# Patient Record
Sex: Female | Born: 1965 | Race: White | Hispanic: No | Smoking: Former smoker
Health system: Southern US, Community
[De-identification: ages and names within clinical notes are randomized; demographics above are authoritative.]

## PROBLEM LIST (undated history)

## (undated) DIAGNOSIS — B977 Papillomavirus as the cause of diseases classified elsewhere: Secondary | ICD-10-CM

## (undated) DIAGNOSIS — Z9889 Other specified postprocedural states: Secondary | ICD-10-CM

## (undated) DIAGNOSIS — F419 Anxiety disorder, unspecified: Secondary | ICD-10-CM

## (undated) DIAGNOSIS — M1990 Unspecified osteoarthritis, unspecified site: Secondary | ICD-10-CM

## (undated) DIAGNOSIS — J189 Pneumonia, unspecified organism: Secondary | ICD-10-CM

## (undated) DIAGNOSIS — E039 Hypothyroidism, unspecified: Secondary | ICD-10-CM

## (undated) DIAGNOSIS — M549 Dorsalgia, unspecified: Secondary | ICD-10-CM

## (undated) DIAGNOSIS — R002 Palpitations: Secondary | ICD-10-CM

## (undated) DIAGNOSIS — K760 Fatty (change of) liver, not elsewhere classified: Secondary | ICD-10-CM

## (undated) DIAGNOSIS — R112 Nausea with vomiting, unspecified: Secondary | ICD-10-CM

## (undated) DIAGNOSIS — R011 Cardiac murmur, unspecified: Secondary | ICD-10-CM

## (undated) DIAGNOSIS — K589 Irritable bowel syndrome without diarrhea: Secondary | ICD-10-CM

## (undated) DIAGNOSIS — F32A Depression, unspecified: Secondary | ICD-10-CM

## (undated) DIAGNOSIS — G4730 Sleep apnea, unspecified: Secondary | ICD-10-CM

## (undated) DIAGNOSIS — I10 Essential (primary) hypertension: Secondary | ICD-10-CM

## (undated) DIAGNOSIS — E559 Vitamin D deficiency, unspecified: Secondary | ICD-10-CM

## (undated) DIAGNOSIS — M2550 Pain in unspecified joint: Secondary | ICD-10-CM

## (undated) HISTORY — DX: Morbid (severe) obesity due to excess calories: E66.01

## (undated) HISTORY — DX: Palpitations: R00.2

## (undated) HISTORY — DX: Sleep apnea, unspecified: G47.30

## (undated) HISTORY — DX: Irritable bowel syndrome, unspecified: K58.9

## (undated) HISTORY — PX: APPENDECTOMY: SHX54

## (undated) HISTORY — DX: Fatty (change of) liver, not elsewhere classified: K76.0

## (undated) HISTORY — DX: Anxiety disorder, unspecified: F41.9

## (undated) HISTORY — DX: Unspecified osteoarthritis, unspecified site: M19.90

## (undated) HISTORY — PX: COLPOSCOPY: SHX161

## (undated) HISTORY — DX: Depression, unspecified: F32.A

## (undated) HISTORY — PX: KNEE ARTHROSCOPY W/ MENISCECTOMY: SHX1879

## (undated) HISTORY — DX: Vitamin D deficiency, unspecified: E55.9

## (undated) HISTORY — PX: WISDOM TOOTH EXTRACTION: SHX21

## (undated) HISTORY — DX: Pain in unspecified joint: M25.50

## (undated) HISTORY — DX: Hypothyroidism, unspecified: E03.9

## (undated) HISTORY — DX: Dorsalgia, unspecified: M54.9

---

## 1999-05-31 ENCOUNTER — Other Ambulatory Visit: Admission: RE | Admit: 1999-05-31 | Discharge: 1999-05-31 | Payer: Self-pay | Admitting: Obstetrics and Gynecology

## 1999-12-08 ENCOUNTER — Inpatient Hospital Stay (HOSPITAL_COMMUNITY): Admission: AD | Admit: 1999-12-08 | Discharge: 1999-12-08 | Payer: Self-pay | Admitting: Obstetrics and Gynecology

## 1999-12-14 ENCOUNTER — Inpatient Hospital Stay (HOSPITAL_COMMUNITY): Admission: AD | Admit: 1999-12-14 | Discharge: 1999-12-17 | Payer: Self-pay | Admitting: *Deleted

## 2000-07-23 ENCOUNTER — Other Ambulatory Visit: Admission: RE | Admit: 2000-07-23 | Discharge: 2000-07-23 | Payer: Self-pay | Admitting: Obstetrics and Gynecology

## 2001-07-31 ENCOUNTER — Other Ambulatory Visit: Admission: RE | Admit: 2001-07-31 | Discharge: 2001-07-31 | Payer: Self-pay | Admitting: Obstetrics and Gynecology

## 2001-12-18 ENCOUNTER — Encounter: Admission: RE | Admit: 2001-12-18 | Discharge: 2001-12-18 | Payer: Self-pay | Admitting: Endocrinology

## 2001-12-18 ENCOUNTER — Encounter: Payer: Self-pay | Admitting: Endocrinology

## 2002-05-21 ENCOUNTER — Ambulatory Visit (HOSPITAL_COMMUNITY): Admission: RE | Admit: 2002-05-21 | Discharge: 2002-05-21 | Payer: Self-pay | Admitting: Obstetrics and Gynecology

## 2002-05-21 ENCOUNTER — Encounter: Payer: Self-pay | Admitting: Obstetrics and Gynecology

## 2002-06-11 ENCOUNTER — Encounter: Payer: Self-pay | Admitting: Obstetrics and Gynecology

## 2002-06-11 ENCOUNTER — Ambulatory Visit (HOSPITAL_COMMUNITY): Admission: RE | Admit: 2002-06-11 | Discharge: 2002-06-11 | Payer: Self-pay | Admitting: Obstetrics and Gynecology

## 2002-07-30 ENCOUNTER — Inpatient Hospital Stay (HOSPITAL_COMMUNITY): Admission: AD | Admit: 2002-07-30 | Discharge: 2002-07-30 | Payer: Self-pay | Admitting: Obstetrics and Gynecology

## 2002-10-20 ENCOUNTER — Inpatient Hospital Stay (HOSPITAL_COMMUNITY): Admission: AD | Admit: 2002-10-20 | Discharge: 2002-10-22 | Payer: Self-pay | Admitting: Obstetrics and Gynecology

## 2002-12-08 ENCOUNTER — Other Ambulatory Visit: Admission: RE | Admit: 2002-12-08 | Discharge: 2002-12-08 | Payer: Self-pay | Admitting: Obstetrics and Gynecology

## 2003-12-24 ENCOUNTER — Other Ambulatory Visit: Admission: RE | Admit: 2003-12-24 | Discharge: 2003-12-24 | Payer: Self-pay | Admitting: Obstetrics and Gynecology

## 2005-02-28 ENCOUNTER — Other Ambulatory Visit: Admission: RE | Admit: 2005-02-28 | Discharge: 2005-02-28 | Payer: Self-pay | Admitting: Obstetrics and Gynecology

## 2005-04-26 ENCOUNTER — Ambulatory Visit (HOSPITAL_COMMUNITY): Admission: RE | Admit: 2005-04-26 | Discharge: 2005-04-26 | Payer: Self-pay | Admitting: Obstetrics and Gynecology

## 2006-03-06 ENCOUNTER — Other Ambulatory Visit: Admission: RE | Admit: 2006-03-06 | Discharge: 2006-03-06 | Payer: Self-pay | Admitting: Obstetrics and Gynecology

## 2006-03-06 DIAGNOSIS — R87619 Unspecified abnormal cytological findings in specimens from cervix uteri: Secondary | ICD-10-CM | POA: Insufficient documentation

## 2006-04-08 HISTORY — PX: COLPOSCOPY: SHX161

## 2008-01-30 ENCOUNTER — Ambulatory Visit (HOSPITAL_COMMUNITY): Admission: RE | Admit: 2008-01-30 | Discharge: 2008-01-30 | Payer: Self-pay | Admitting: Obstetrics and Gynecology

## 2009-06-22 ENCOUNTER — Ambulatory Visit (HOSPITAL_COMMUNITY): Admission: RE | Admit: 2009-06-22 | Discharge: 2009-06-22 | Payer: Self-pay | Admitting: Obstetrics and Gynecology
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
## 2010-09-29 NOTE — Op Note (Signed)
[facility]  Patient:    SHALLEN, LUEDKE                   MRN: [id number] Proc. Date: 12/15/99 Adm. Date:  [id number] Disc. Date: [id number] Attending:  Leonard Schwartz                           Operative Report  OPERATION:                    Vacuum-assisted vaginal delivery with a ____                               vacuum device.  SURGEON:                      Georgina Peer, M.D.  ASSISTANT:                    Miguel Dibble, C.N.M.  ANESTHESIA:                   Epidural.  INDICATIONS:                  This is a 45 year old primigravida at 41-6/7 weeks.  Progressed to complete dilatation at 5:46.  Rested and began pushing. Pushed for three hours, became fatigued, and asked for assistance.  Vacuum assisted delivery was offered to the patient.  Risks and complications including accentuation of caput, bruising, caput hematoma, septal hematoma, and intracranial hemorrhage also discussed and accepted.  DESCRIPTION OF PROCEDURE:     The vertex was at +2 to +3, LOA position.  The vacuum was applied and delivery was effected within five contractions over a second degree midline episiotomy at 11:47 of a viable female infant with Apgars of 8 and 9.  The second degree episiotomy was repaired without extension with absorbable suture.  The placenta spontaneously delivered at 12:17, three vessels, bilobed and intact.  Infant went to regular nursery. Estimated blood loss was 300 cc. DD:  12/15/99 TD:  12/18/99 Job: [id number]

## 2011-06-17 ENCOUNTER — Ambulatory Visit (INDEPENDENT_AMBULATORY_CARE_PROVIDER_SITE_OTHER): Payer: Managed Care, Other (non HMO) | Admitting: Physician Assistant

## 2011-06-17 VITALS — BP 122/78 | HR 79 | Temp 98.4°F | Resp 16 | Ht 65.0 in | Wt 258.0 lb

## 2011-06-17 DIAGNOSIS — J069 Acute upper respiratory infection, unspecified: Secondary | ICD-10-CM

## 2011-06-17 DIAGNOSIS — R05 Cough: Secondary | ICD-10-CM

## 2011-06-17 LAB — POCT INFLUENZA A/B
Influenza A, POC: NEGATIVE
Influenza B, POC: NEGATIVE

## 2011-06-17 MED ORDER — ALBUTEROL SULFATE HFA 108 (90 BASE) MCG/ACT IN AERS: 2.0000 | INHALATION_SPRAY | Freq: Every day | RESPIRATORY_TRACT | Status: DC

## 2011-06-17 MED ORDER — PROMETHAZINE-DM 6.25-15 MG/5ML PO SYRP: 5.0000 mL | ORAL_SOLUTION | Freq: Every day | ORAL | Status: AC

## 2011-06-17 MED ORDER — IPRATROPIUM BROMIDE 0.06 % NA SOLN: 2.0000 | Freq: Four times a day (QID) | NASAL | Status: DC

## 2011-06-17 NOTE — Patient Instructions (Signed)
Watch for fever/chills, shortness of breath, prolonged fever. Return if these things occur. Ibuprofen 200 mg up to 4 tabs every 6 hours Mucinex DM as directed    Upper Respiratory Infection, Adult An upper respiratory infection (URI) is also known as the common cold. It is often caused by a type of germ (virus). Colds are easily spread (contagious). You can pass it to others by kissing, coughing, sneezing, or drinking out of the same glass. Usually, you get better in 1 or 2 weeks.  HOME CARE   Only take medicine as told by your doctor.   Use a warm mist humidifier or breathe in steam from a hot shower.   Drink enough water and fluids to keep your pee (urine) clear or pale yellow.   Get plenty of rest.   Return to work when your temperature is back to normal or as told by your doctor. You may use a face mask and wash your hands to stop your cold from spreading.  GET HELP RIGHT AWAY IF:   After the first few days, you feel you are getting worse.   You have questions about your medicine.   You have chills, shortness of breath, or brown or red spit (mucus).   You have yellow or brown snot (nasal discharge) or pain in the face, especially when you bend forward.   You have a fever, puffy (swollen) neck, pain when you swallow, or white spots in the back of your throat.   You have a bad headache, ear pain, sinus pain, or chest pain.   You have a high-pitched whistling sound when you breathe in and out (wheezing).   You have a lasting cough or cough up blood.   You have sore muscles or a stiff neck.  MAKE SURE YOU:   Understand these instructions.   Will watch your condition.   Will get help right away if you are not doing well or get worse.  Document Released: 10/17/2007 Document Revised: 01/10/2011 Document Reviewed: 09/04/2010 [facility] Patient Information 2012 [place], [place].

## 2011-06-17 NOTE — Progress Notes (Signed)
  Subjective:    Patient ID: Dana Liu, female    DOB: 17-Jul-1965, 46 y.o.   MRN: [id number]  HPI    Review of Systems  Constitutional: Positive for fever (99.9). Negative for chills.  HENT: Positive for congestion and sore throat. Negative for rhinorrhea.   Eyes: Negative for visual disturbance.  Respiratory: Positive for cough (paroxysmal) and shortness of breath.   Cardiovascular: Negative for chest pain.  Musculoskeletal: Positive for myalgias.  Neurological: Positive for headaches.       Objective:   Physical Exam  Constitutional: She appears well-developed and well-nourished.  HENT:  Right Ear: Tympanic membrane and external ear normal.  Left Ear: Tympanic membrane and external ear normal.  Nose: Nose normal.  Mouth/Throat: Posterior oropharyngeal erythema present. No oropharyngeal exudate.  Pulmonary/Chest: Effort normal and breath sounds normal.  Lymphadenopathy:    She has no cervical adenopathy.    Results for orders placed in visit on 06/17/11  POCT INFLUENZA A/B      Component Value Range   Influenza A, POC Negative     Influenza B, POC Negative           Assessment & Plan:   1. URI (upper respiratory infection)  promethazine-dextromethorphan (PROMETHAZINE-DM) 6.25-15 MG/5ML syrup, albuterol (PROVENTIL HFA;VENTOLIN HFA) 108 (90 BASE) MCG/ACT inhaler, ipratropium (ATROVENT) 0.06 % nasal spray  2. Cough  POCT Influenza A/B, promethazine-dextromethorphan (PROMETHAZINE-DM) 6.25-15 MG/5ML syrup, albuterol (PROVENTIL HFA;VENTOLIN HFA) 108 (90 BASE) MCG/ACT inhaler   OOW note 2/4 Return if increased SOB, fever, worsening symptoms

## 2011-07-23 ENCOUNTER — Encounter: Payer: Self-pay | Admitting: Obstetrics and Gynecology

## 2011-07-27 ENCOUNTER — Encounter: Payer: Self-pay | Admitting: Obstetrics and Gynecology

## 2011-08-08 ENCOUNTER — Encounter (INDEPENDENT_AMBULATORY_CARE_PROVIDER_SITE_OTHER): Payer: Commercial Indemnity | Admitting: Obstetrics and Gynecology

## 2011-08-08 DIAGNOSIS — Z3040 Encounter for surveillance of contraceptives, unspecified: Secondary | ICD-10-CM

## 2011-08-20 ENCOUNTER — Encounter (INDEPENDENT_AMBULATORY_CARE_PROVIDER_SITE_OTHER): Payer: Commercial Indemnity | Admitting: Obstetrics and Gynecology

## 2011-08-20 DIAGNOSIS — Z3049 Encounter for surveillance of other contraceptives: Secondary | ICD-10-CM

## 2011-08-20 DIAGNOSIS — Z30430 Encounter for insertion of intrauterine contraceptive device: Secondary | ICD-10-CM

## 2011-09-17 ENCOUNTER — Encounter: Payer: Commercial Indemnity | Admitting: Obstetrics and Gynecology

## 2011-09-19 ENCOUNTER — Ambulatory Visit (INDEPENDENT_AMBULATORY_CARE_PROVIDER_SITE_OTHER): Payer: Commercial Indemnity | Admitting: Obstetrics and Gynecology

## 2011-09-19 ENCOUNTER — Encounter: Payer: Self-pay | Admitting: Obstetrics and Gynecology

## 2011-09-19 VITALS — BP 114/72 | Temp 98.6°F | Wt 250.0 lb

## 2011-09-19 DIAGNOSIS — R05 Cough: Secondary | ICD-10-CM

## 2011-09-19 DIAGNOSIS — J069 Acute upper respiratory infection, unspecified: Secondary | ICD-10-CM

## 2011-09-19 DIAGNOSIS — N6320 Unspecified lump in the left breast, unspecified quadrant: Secondary | ICD-10-CM

## 2011-09-19 DIAGNOSIS — J40 Bronchitis, not specified as acute or chronic: Secondary | ICD-10-CM

## 2011-09-19 DIAGNOSIS — N63 Unspecified lump in unspecified breast: Secondary | ICD-10-CM

## 2011-09-19 DIAGNOSIS — Z975 Presence of (intrauterine) contraceptive device: Secondary | ICD-10-CM

## 2011-09-19 DIAGNOSIS — G4730 Sleep apnea, unspecified: Secondary | ICD-10-CM

## 2011-09-19 DIAGNOSIS — G4733 Obstructive sleep apnea (adult) (pediatric): Secondary | ICD-10-CM | POA: Insufficient documentation

## 2011-09-19 DIAGNOSIS — Z30431 Encounter for routine checking of intrauterine contraceptive device: Secondary | ICD-10-CM

## 2011-09-19 MED ORDER — AZITHROMYCIN 250 MG PO TABS: ORAL_TABLET | ORAL | Status: AC

## 2011-09-19 MED ORDER — ALBUTEROL SULFATE HFA 108 (90 BASE) MCG/ACT IN AERS
2.0000 | INHALATION_SPRAY | Freq: Four times a day (QID) | RESPIRATORY_TRACT | Status: DC | PRN
Start: 2011-09-19 — End: 2016-05-01

## 2011-09-19 NOTE — Progress Notes (Signed)
46 YO with Mirena insertion 08/20/11 returns for follow-up.  She also complains of wet cough without sputum production for 1 week.  + nasal congestion, sore throat but no fever, myalgias, post-nasal drainage or ear pain.  Has taken Robitussin and will occasionally awaken at night.  Has had urine leaking with cough so she wears a pad but in relation to IUD changes in pads are every 4 hours.    Since IUD insertion has a spot on left breast that is tender. (feels bruised)  This has decreased since beginning.  Lungs: coarse breath sounds with increase expiratory phase and rhonchi  Heart regular rate and rhythm Left breast- laterally tender cystic mass at 3 o'clock approximately  2 cm x 2cm   Pelvic: EGBUS-wnl, vagina-scant blood, cervix-string visible, uterus-non-tender, appears normal size (exam limited by habitus)  UPT-negative U/A-negative  A; IUD Check     Bronchitis     Left Breast Mass     Incontinence due to cough     H/O Pneumonia x2       P: Albuterol Inhaler 2 puffs every 6 hours x 5 days then prn      Z-pak   #1 use as directed      Diagnostic Mammogram with ultrasound prn

## 2011-09-26 ENCOUNTER — Ambulatory Visit
Admission: RE | Admit: 2011-09-26 | Discharge: 2011-09-26 | Disposition: A | Discharge status: Home / Self Care | Payer: Managed Care, Other (non HMO) | Source: Ambulatory Visit | Attending: Obstetrics and Gynecology | Admitting: Obstetrics and Gynecology

## 2011-09-26 ENCOUNTER — Other Ambulatory Visit: Payer: Self-pay | Admitting: Obstetrics and Gynecology

## 2011-09-26 DIAGNOSIS — N6320 Unspecified lump in the left breast, unspecified quadrant: Secondary | ICD-10-CM

## 2011-10-22 ENCOUNTER — Ambulatory Visit (INDEPENDENT_AMBULATORY_CARE_PROVIDER_SITE_OTHER): Payer: Commercial Indemnity | Admitting: Obstetrics and Gynecology

## 2011-10-22 ENCOUNTER — Encounter: Payer: Self-pay | Admitting: Obstetrics and Gynecology

## 2011-10-22 VITALS — BP 112/78 | HR 84 | Ht 65.0 in | Wt 258.0 lb

## 2011-10-22 DIAGNOSIS — Z124 Encounter for screening for malignant neoplasm of cervix: Secondary | ICD-10-CM

## 2011-10-22 NOTE — Progress Notes (Signed)
Last Pap: 10/18/10 WNL: Yes Regular Periods:no Contraception: Mirena  Monthly Breast exam:yes Tetanus<57yrs:yes Nl.Bladder Function:yes Daily BMs:yes Healthy Diet:yes Calcium:yes Mammogram:yes 09/2011  Exercise:no Seatbelt: yes Abuse at home: no Stressful work: no Sigmoid-colonoscopy: n/a Bone Density: No Pt has questions about break through bleeding with Mirena. [name], [place] Pt without complaints Physical Examination: General appearance - alert, well appearing, and in no distress Mental status - normal mood, behavior, speech, dress, motor activity, and thought processes Neck - supple, no significant adenopathy, thyroid exam: thyroid is normal in size without nodules or tenderness Chest - clear to auscultation, no wheezes, rales or rhonchi, symmetric air entry Heart - normal rate and regular rhythm Abdomen - soft, nontender, nondistended, no masses or organomegaly Breasts - breasts appear normal, no suspicious masses, no skin or nipple changes or axillary nodes Pelvic - normal external genitalia, vulva, vagina, cervix, uterus and adnexa Rectal - normal rectal, no masses, rectal exam not indicated Back exam - full range of motion, no tenderness, palpable spasm or pain on motion Neurological - alert, oriented, normal speech, no focal findings or movement disorder noted Musculoskeletal - no joint tenderness, deformity or swelling Extremities - no edema, redness or tenderness in the calves or thighs Skin - normal coloration and turgor, no rashes, no suspicious skin lesions noted Routine exam Pap sent yes Mammogram due no *Mirena. Pt reassured about irreg spotting RT 1 yr

## 2011-10-24 LAB — PAP IG W/ RFLX HPV ASCU

## 2012-12-10 ENCOUNTER — Other Ambulatory Visit: Payer: Self-pay

## 2012-12-10 DIAGNOSIS — Z1231 Encounter for screening mammogram for malignant neoplasm of breast: Secondary | ICD-10-CM

## 2012-12-25 ENCOUNTER — Ambulatory Visit
Admission: RE | Admit: 2012-12-25 | Discharge: 2012-12-25 | Disposition: A | Discharge status: Home / Self Care | Payer: Managed Care, Other (non HMO) | Source: Ambulatory Visit

## 2012-12-25 DIAGNOSIS — Z1231 Encounter for screening mammogram for malignant neoplasm of breast: Secondary | ICD-10-CM

## 2013-11-17 ENCOUNTER — Other Ambulatory Visit (HOSPITAL_COMMUNITY): Payer: Self-pay | Admitting: Obstetrics and Gynecology

## 2013-11-17 DIAGNOSIS — Z1231 Encounter for screening mammogram for malignant neoplasm of breast: Secondary | ICD-10-CM

## 2013-12-28 ENCOUNTER — Ambulatory Visit (HOSPITAL_COMMUNITY)
Admission: RE | Admit: 2013-12-28 | Discharge: 2013-12-28 | Disposition: A | Discharge status: Home / Self Care | Payer: BC Managed Care – PPO | Source: Ambulatory Visit | Attending: Obstetrics and Gynecology | Admitting: Obstetrics and Gynecology

## 2013-12-28 DIAGNOSIS — Z1231 Encounter for screening mammogram for malignant neoplasm of breast: Secondary | ICD-10-CM | POA: Diagnosis present

## 2014-03-15 ENCOUNTER — Encounter: Payer: Self-pay | Admitting: Obstetrics and Gynecology

## 2014-05-14 HISTORY — PX: HYSTEROSCOPY W/ ENDOMETRIAL ABLATION: SUR665

## 2015-01-04 ENCOUNTER — Other Ambulatory Visit: Payer: Self-pay | Admitting: Obstetrics and Gynecology

## 2015-01-04 DIAGNOSIS — N6489 Other specified disorders of breast: Secondary | ICD-10-CM

## 2015-01-13 ENCOUNTER — Other Ambulatory Visit: Payer: BC Managed Care – PPO

## 2015-01-21 ENCOUNTER — Ambulatory Visit
Admission: RE | Admit: 2015-01-21 | Discharge: 2015-01-21 | Disposition: A | Discharge status: Home / Self Care | Payer: BC Managed Care – PPO | Source: Ambulatory Visit | Attending: Obstetrics and Gynecology | Admitting: Obstetrics and Gynecology

## 2015-01-21 ENCOUNTER — Other Ambulatory Visit: Payer: Self-pay | Admitting: Obstetrics and Gynecology

## 2015-01-21 DIAGNOSIS — N6489 Other specified disorders of breast: Secondary | ICD-10-CM

## 2015-01-28 ENCOUNTER — Other Ambulatory Visit: Payer: Self-pay | Admitting: Obstetrics and Gynecology

## 2015-01-28 DIAGNOSIS — N6489 Other specified disorders of breast: Secondary | ICD-10-CM

## 2015-01-31 ENCOUNTER — Ambulatory Visit
Admission: RE | Admit: 2015-01-31 | Discharge: 2015-01-31 | Disposition: A | Discharge status: Home / Self Care | Payer: BC Managed Care – PPO | Source: Ambulatory Visit | Attending: Obstetrics and Gynecology | Admitting: Obstetrics and Gynecology

## 2015-01-31 ENCOUNTER — Other Ambulatory Visit: Payer: Self-pay | Admitting: Obstetrics and Gynecology

## 2015-01-31 DIAGNOSIS — N6489 Other specified disorders of breast: Secondary | ICD-10-CM

## 2016-05-01 ENCOUNTER — Other Ambulatory Visit: Payer: Self-pay | Admitting: Obstetrics and Gynecology

## 2016-05-02 ENCOUNTER — Encounter: Payer: Self-pay | Admitting: Obstetrics and Gynecology

## 2016-05-03 ENCOUNTER — Encounter (HOSPITAL_COMMUNITY)
Admission: RE | Admit: 2016-05-03 | Discharge: 2016-05-03 | Disposition: A | Discharge status: Home / Self Care | Payer: BC Managed Care – PPO | Source: Ambulatory Visit | Attending: Obstetrics and Gynecology | Admitting: Obstetrics and Gynecology

## 2016-05-03 ENCOUNTER — Encounter (HOSPITAL_COMMUNITY): Payer: Self-pay

## 2016-05-03 DIAGNOSIS — Z01812 Encounter for preprocedural laboratory examination: Secondary | ICD-10-CM | POA: Insufficient documentation

## 2016-05-03 DIAGNOSIS — N83209 Unspecified ovarian cyst, unspecified side: Secondary | ICD-10-CM | POA: Diagnosis not present

## 2016-05-03 HISTORY — DX: Papillomavirus as the cause of diseases classified elsewhere: B97.7

## 2016-05-03 HISTORY — DX: Unspecified osteoarthritis, unspecified site: M19.90

## 2016-05-03 HISTORY — DX: Cardiac murmur, unspecified: R01.1

## 2016-05-03 HISTORY — DX: Pneumonia, unspecified organism: J18.9

## 2016-05-03 HISTORY — DX: Essential (primary) hypertension: I10

## 2016-05-03 LAB — CBC
HCT: 43.5 % (ref 36.0–46.0)
Hemoglobin: 14.8 g/dL (ref 12.0–15.0)
MCH: 30.5 pg (ref 26.0–34.0)
MCHC: 34 g/dL (ref 30.0–36.0)
MCV: 89.7 fL (ref 78.0–100.0)
PLATELETS: 277 10*3/uL (ref 150–400)
RBC: 4.85 MIL/uL (ref 3.87–5.11)
RDW: 12.7 % (ref 11.5–15.5)
WBC: 9.9 10*3/uL (ref 4.0–10.5)

## 2016-05-03 NOTE — Patient Instructions (Addendum)
Your procedure is scheduled on:  Friday, Dec, 29, 2017  Enter through the [employer] of [facility] at:  12:30 PM  Pick up the phone at the desk and dial [phone].  Call this number if you have problems the morning of surgery: [phone].  Remember: Do NOT eat food:  After Midnight Thursday  Do NOT drink clear liquids after:  10:00 AM day of surgery  Take these medicines the morning of surgery with a SIP OF WATER:  Citalopram, Levothyroxine  Stop ALL herbal medications and fish oil at this time   Do NOT wear jewelry (body piercing), metal hair clips/bobby pins, make-up, or nail polish. Do NOT wear lotions, powders, or perfumes.  You may wear deodorant. Do NOT shave for 48 hours prior to surgery. Do NOT bring valuables to the hospital. Contacts, dentures, or bridgework may not be worn into surgery.  Have a responsible adult drive you home and stay with you for 24 hours after your procedure

## 2016-05-03 NOTE — H&P (Signed)
Dana Liu is a 50 y.o. female,   P: 2-0-0-2 who presents for removal of a  complex left ovarian cyst.  During the patient's  annual exam in September 2017, the patient's IUD string was not visible.  Additionally she reported having several months of daily spotting when she previously has not had any.  A pelvic ultrasound performed at that time showed an 8.5 simple appearing left adnexal mass, obscuring the left ovary and a simple right ovarian cyst measuring 3.4 cm.  The patient went on to report feeling bloated, difficulty starting the defecation process (but no constipation) and pain with hip flexion and other bending positions.  She is not sexually active and has not had any changes in bladder function.  She admits to occasional cramping rated 6/10 on a 10 point pain scale,  accompanied by an increase in her vaginal spotting requiring the use of a pad 3 times a day. Fortunately she finds relief with Ibuprofen 400mg .  A follow up pelvic ultrasound in November 2017 showed a uterus: 6.48 x 3.95 x 4.46 cm,  endometrium: 0.51 cm, right ovary-5.59 cm and left ovary-9.18 cm with a cyst containing debris measuring 8.9 cm.   Given the increasing size of the left ovarian cyst and related symptoms,  the patient has decided to proceed with removal of her left ovarian cyst.   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   ROS: Admits left knee swelling and pain but denies headache, vision changes, nasal congestion, dysphagia, tinnitus, dizziness, hoarseness, cough,  chest pain, shortness of breath, nausea, vomiting, diarrhea, constipation,  urinary frequency, urgency  dysuria, hematuria, vaginitis symptoms,  easy bruising,  myalgias, arthralgias, skin rashes, unexplained weight loss and except as is mentioned in the history of present illness, patient's review of systems is otherwise negative.   Physical Exam  Bp: 130/70  P:  72 bpm    R: 16    Temperature: 98.6 degrees F orally  Weight: 237 lbs.  Height: 5' 4.25 "   BMI: 40.4  Neck: supple without masses or thyromegaly Lungs: clear to auscultation Heart: regular rate and rhythm Abdomen: soft, left lower quadrant tenderness without guarding or rebound; no organomegaly Pelvic:EGBUS- wnl; vagina-normal rugae; uterus-normal size, cervix without lesions or motion tenderness; adnexae-left  tenderness with palpable mass Extremities:  no clubbing, cyanosis or edema   Assesment: Large Complex Left Ovarian Cyst   Disposition:  A discussion was held with patient regarding the indication for her procedure(s) along with the risks, which include but are not limited to: reaction to anesthesia, damage to adjacent organs, infection, excessive  bleeding and possible removal of ovarian tissue. The patient verbalized understanding of these risks and has consented to proceed with a Laparoscopic Ovarian Cystectomy at [place] on May 11, 2016 at  2 p.m.    CSN# [id number]   Sora Olivo J. Florene Glen, PA-C  for Dr.Naima A. Dillard

## 2016-05-03 NOTE — Patient Instructions (Addendum)
Your procedure is scheduled on:  Friday, Dec. 29, 2017  Enter through the [employer] of [facility] at:  12:30 PM  Pick up the phone at the desk and dial [phone].  Call this number if you have problems the morning of surgery: [phone].  Remember: Do NOT eat food:  After midnight Thursday  Do NOT drink clear liquids after:  10:00 AM day of surgery  Take these medicines the morning of surgery with a SIP OF WATER:  Citalopram, Levothyroxine  Bring CPAP machine day of surgery  Stop ALL herbal medications and fish oil at this time   Do NOT wear jewelry (body piercing), metal hair clips/bobby pins, make-up, or nail polish. Do NOT wear lotions, powders, or perfumes.  You may wear deodorant. Do NOT shave for 48 hours prior to surgery. Do NOT bring valuables to the hospital. Contacts, dentures, or bridgework may not be worn into surgery.  Have a responsible adult drive you home and stay with you for 24 hours after your procedure

## 2016-05-04 ENCOUNTER — Inpatient Hospital Stay (HOSPITAL_COMMUNITY)
Admission: RE | Admit: 2016-05-04 | Discharge: 2016-05-04 | Disposition: A | Discharge status: Home / Self Care | Payer: BC Managed Care – PPO | Source: Ambulatory Visit

## 2016-05-11 ENCOUNTER — Encounter (HOSPITAL_COMMUNITY): Payer: Self-pay | Admitting: Anesthesiology

## 2016-05-11 ENCOUNTER — Ambulatory Visit (HOSPITAL_COMMUNITY)
Admission: RE | Admit: 2016-05-11 | Discharge: 2016-05-11 | Disposition: A | Discharge status: Home / Self Care | Payer: BC Managed Care – PPO | Source: Ambulatory Visit | Attending: Obstetrics and Gynecology | Admitting: Obstetrics and Gynecology

## 2016-05-11 ENCOUNTER — Ambulatory Visit (HOSPITAL_COMMUNITY): Payer: BC Managed Care – PPO | Admitting: Anesthesiology

## 2016-05-11 ENCOUNTER — Encounter (HOSPITAL_COMMUNITY)
Admission: RE | Disposition: A | Discharge status: Home / Self Care | Payer: Self-pay | Source: Ambulatory Visit | Attending: Obstetrics and Gynecology

## 2016-05-11 DIAGNOSIS — D271 Benign neoplasm of left ovary: Secondary | ICD-10-CM | POA: Insufficient documentation

## 2016-05-11 DIAGNOSIS — Z87891 Personal history of nicotine dependence: Secondary | ICD-10-CM | POA: Insufficient documentation

## 2016-05-11 DIAGNOSIS — R102 Pelvic and perineal pain: Secondary | ICD-10-CM | POA: Diagnosis not present

## 2016-05-11 DIAGNOSIS — Z30432 Encounter for removal of intrauterine contraceptive device: Secondary | ICD-10-CM | POA: Diagnosis not present

## 2016-05-11 DIAGNOSIS — F329 Major depressive disorder, single episode, unspecified: Secondary | ICD-10-CM | POA: Diagnosis not present

## 2016-05-11 DIAGNOSIS — M1712 Unilateral primary osteoarthritis, left knee: Secondary | ICD-10-CM | POA: Insufficient documentation

## 2016-05-11 DIAGNOSIS — E039 Hypothyroidism, unspecified: Secondary | ICD-10-CM | POA: Diagnosis not present

## 2016-05-11 DIAGNOSIS — Z6839 Body mass index (BMI) 39.0-39.9, adult: Secondary | ICD-10-CM | POA: Diagnosis not present

## 2016-05-11 DIAGNOSIS — N83202 Unspecified ovarian cyst, left side: Secondary | ICD-10-CM | POA: Diagnosis present

## 2016-05-11 DIAGNOSIS — G4730 Sleep apnea, unspecified: Secondary | ICD-10-CM | POA: Insufficient documentation

## 2016-05-11 DIAGNOSIS — E669 Obesity, unspecified: Secondary | ICD-10-CM | POA: Diagnosis not present

## 2016-05-11 HISTORY — DX: Other specified postprocedural states: Z98.890

## 2016-05-11 HISTORY — DX: Nausea with vomiting, unspecified: R11.2

## 2016-05-11 HISTORY — PX: LAPAROSCOPIC OVARIAN CYSTECTOMY: SHX6248

## 2016-05-11 HISTORY — PX: IUD REMOVAL: SHX5392

## 2016-05-11 LAB — PREGNANCY, URINE: Preg Test, Ur: NEGATIVE

## 2016-05-11 SURGERY — EXCISION, CYST, OVARY, LAPAROSCOPIC
Anesthesia: General | Site: Vagina

## 2016-05-11 MED ORDER — FENTANYL CITRATE (PF) 100 MCG/2ML IJ SOLN
INTRAMUSCULAR | Status: DC | PRN
  Administered 2016-05-11 (×2): 50 ug via INTRAVENOUS
  Administered 2016-05-11: 100 ug via INTRAVENOUS
  Administered 2016-05-11 (×2): 50 ug via INTRAVENOUS

## 2016-05-11 MED ORDER — MEPERIDINE HCL 25 MG/ML IJ SOLN: 6.2500 mg | INTRAMUSCULAR | Status: DC | PRN

## 2016-05-11 MED ORDER — GLYCOPYRROLATE 0.2 MG/ML IJ SOLN
INTRAMUSCULAR | Status: DC | PRN
  Administered 2016-05-11: 0.4 mg via INTRAVENOUS
  Administered 2016-05-11: 0.2 mg via INTRAVENOUS

## 2016-05-11 MED ORDER — LIDOCAINE HCL (CARDIAC) 20 MG/ML IV SOLN
INTRAVENOUS | Status: AC
  Filled 2016-05-11: qty 5

## 2016-05-11 MED ORDER — HYDROCODONE-ACETAMINOPHEN 5-325 MG PO TABS: 1.0000 | ORAL_TABLET | Freq: Four times a day (QID) | ORAL | 0 refills | Status: DC | PRN

## 2016-05-11 MED ORDER — DEXAMETHASONE SODIUM PHOSPHATE 10 MG/ML IJ SOLN
INTRAMUSCULAR | Status: DC | PRN
  Administered 2016-05-11: 4 mg via INTRAVENOUS

## 2016-05-11 MED ORDER — LACTATED RINGERS IR SOLN
Status: DC | PRN
  Administered 2016-05-11: 3000 mL

## 2016-05-11 MED ORDER — GLYCOPYRROLATE 0.2 MG/ML IJ SOLN
INTRAMUSCULAR | Status: AC
  Filled 2016-05-11: qty 1

## 2016-05-11 MED ORDER — NEOSTIGMINE METHYLSULFATE 10 MG/10ML IV SOLN
INTRAVENOUS | Status: AC
  Filled 2016-05-11: qty 1

## 2016-05-11 MED ORDER — DEXAMETHASONE SODIUM PHOSPHATE 4 MG/ML IJ SOLN
INTRAMUSCULAR | Status: AC
  Filled 2016-05-11: qty 1

## 2016-05-11 MED ORDER — LACTATED RINGERS IV SOLN
INTRAVENOUS | Status: DC
  Administered 2016-05-11 (×2): via INTRAVENOUS

## 2016-05-11 MED ORDER — ATROPINE SULFATE 0.4 MG/ML IJ SOLN
INTRAMUSCULAR | Status: DC | PRN
  Administered 2016-05-11: 0.4 mg via INTRAVENOUS

## 2016-05-11 MED ORDER — FENTANYL CITRATE (PF) 250 MCG/5ML IJ SOLN
INTRAMUSCULAR | Status: AC
  Filled 2016-05-11: qty 5

## 2016-05-11 MED ORDER — ATROPINE SULFATE 0.4 MG/ML IJ SOLN
INTRAMUSCULAR | Status: AC
  Filled 2016-05-11: qty 1

## 2016-05-11 MED ORDER — ROCURONIUM BROMIDE 100 MG/10ML IV SOLN
INTRAVENOUS | Status: AC
  Filled 2016-05-11: qty 1

## 2016-05-11 MED ORDER — ONDANSETRON HCL 4 MG/2ML IJ SOLN
INTRAMUSCULAR | Status: DC | PRN
  Administered 2016-05-11: 4 mg via INTRAVENOUS

## 2016-05-11 MED ORDER — ROCURONIUM BROMIDE 100 MG/10ML IV SOLN
INTRAVENOUS | Status: DC | PRN
  Administered 2016-05-11: 50 mg via INTRAVENOUS

## 2016-05-11 MED ORDER — FENTANYL CITRATE (PF) 100 MCG/2ML IJ SOLN
INTRAMUSCULAR | Status: AC
  Filled 2016-05-11: qty 2

## 2016-05-11 MED ORDER — PROPOFOL 10 MG/ML IV BOLUS
INTRAVENOUS | Status: DC | PRN
  Administered 2016-05-11: 170 mg via INTRAVENOUS

## 2016-05-11 MED ORDER — IBUPROFEN 600 MG PO TABS: 600.0000 mg | ORAL_TABLET | Freq: Four times a day (QID) | ORAL | 0 refills | Status: DC | PRN

## 2016-05-11 MED ORDER — DIPHENHYDRAMINE HCL 50 MG/ML IJ SOLN
INTRAMUSCULAR | Status: AC
  Administered 2016-05-11: 12.5 mg via INTRAVENOUS
  Filled 2016-05-11: qty 1

## 2016-05-11 MED ORDER — HYDROCODONE-ACETAMINOPHEN 7.5-325 MG PO TABS: 1.0000 | ORAL_TABLET | Freq: Once | ORAL | Status: DC | PRN

## 2016-05-11 MED ORDER — HYDROMORPHONE HCL 1 MG/ML IJ SOLN: 0.2500 mg | INTRAMUSCULAR | Status: DC | PRN

## 2016-05-11 MED ORDER — METOCLOPRAMIDE HCL 5 MG/ML IJ SOLN: 10.0000 mg | Freq: Once | INTRAMUSCULAR | Status: DC | PRN

## 2016-05-11 MED ORDER — SCOPOLAMINE 1 MG/3DAYS TD PT72
1.0000 | MEDICATED_PATCH | Freq: Once | TRANSDERMAL | Status: DC
  Administered 2016-05-11: 1.5 mg via TRANSDERMAL

## 2016-05-11 MED ORDER — EPHEDRINE 5 MG/ML INJ
INTRAVENOUS | Status: AC
  Filled 2016-05-11: qty 10

## 2016-05-11 MED ORDER — KETOROLAC TROMETHAMINE 30 MG/ML IJ SOLN
INTRAMUSCULAR | Status: AC
  Filled 2016-05-11: qty 1

## 2016-05-11 MED ORDER — BUPIVACAINE HCL (PF) 0.25 % IJ SOLN
INTRAMUSCULAR | Status: AC
  Filled 2016-05-11: qty 30

## 2016-05-11 MED ORDER — NEOSTIGMINE METHYLSULFATE 10 MG/10ML IV SOLN
INTRAVENOUS | Status: DC | PRN
  Administered 2016-05-11: 3 mg via INTRAVENOUS

## 2016-05-11 MED ORDER — EPHEDRINE SULFATE 50 MG/ML IJ SOLN
INTRAMUSCULAR | Status: DC | PRN
  Administered 2016-05-11: 10 mg via INTRAVENOUS
  Administered 2016-05-11: 5 mg via INTRAVENOUS

## 2016-05-11 MED ORDER — PROPOFOL 10 MG/ML IV BOLUS
INTRAVENOUS | Status: AC
  Filled 2016-05-11: qty 20

## 2016-05-11 MED ORDER — KETOROLAC TROMETHAMINE 30 MG/ML IJ SOLN
INTRAMUSCULAR | Status: DC | PRN
  Administered 2016-05-11: 30 mg via INTRAVENOUS

## 2016-05-11 MED ORDER — SCOPOLAMINE 1 MG/3DAYS TD PT72
MEDICATED_PATCH | TRANSDERMAL | Status: AC
  Administered 2016-05-11: 1.5 mg via TRANSDERMAL
  Filled 2016-05-11: qty 1

## 2016-05-11 MED ORDER — MIDAZOLAM HCL 2 MG/2ML IJ SOLN
INTRAMUSCULAR | Status: AC
  Filled 2016-05-11: qty 2

## 2016-05-11 MED ORDER — BUPIVACAINE-EPINEPHRINE 0.25% -1:200000 IJ SOLN
INTRAMUSCULAR | Status: DC | PRN
  Administered 2016-05-11: 16 mL

## 2016-05-11 MED ORDER — GLYCOPYRROLATE 0.2 MG/ML IJ SOLN
INTRAMUSCULAR | Status: AC
  Filled 2016-05-11: qty 2

## 2016-05-11 MED ORDER — ONDANSETRON HCL 4 MG/2ML IJ SOLN
INTRAMUSCULAR | Status: AC
  Filled 2016-05-11: qty 2

## 2016-05-11 MED ORDER — LIDOCAINE HCL (CARDIAC) 20 MG/ML IV SOLN
INTRAVENOUS | Status: DC | PRN
  Administered 2016-05-11 (×2): 30 mg via INTRAVENOUS

## 2016-05-11 MED ORDER — PROMETHAZINE HCL 12.5 MG PO TABS: 12.5000 mg | ORAL_TABLET | Freq: Four times a day (QID) | ORAL | 0 refills | Status: DC | PRN

## 2016-05-11 MED ORDER — MIDAZOLAM HCL 2 MG/2ML IJ SOLN
INTRAMUSCULAR | Status: DC | PRN
  Administered 2016-05-11: 1 mg via INTRAVENOUS

## 2016-05-11 MED ORDER — DIPHENHYDRAMINE HCL 50 MG/ML IJ SOLN
12.5000 mg | Freq: Once | INTRAMUSCULAR | Status: AC
  Administered 2016-05-11: 12.5 mg via INTRAVENOUS

## 2016-05-11 SURGICAL SUPPLY — 39 items
BARRIER ADHS 3X4 INTERCEED (GAUZE/BANDAGES/DRESSINGS) IMPLANT
CABLE HIGH FREQUENCY MONO STRZ (ELECTRODE) ×6 IMPLANT
CLOTH BEACON ORANGE TIMEOUT ST (SAFETY) ×3 IMPLANT
DERMABOND ADVANCED (GAUZE/BANDAGES/DRESSINGS) ×1
DERMABOND ADVANCED .7 DNX12 (GAUZE/BANDAGES/DRESSINGS) ×2 IMPLANT
DRSG OPSITE POSTOP 3X4 (GAUZE/BANDAGES/DRESSINGS) IMPLANT
DRSG VASELINE 3X18 (GAUZE/BANDAGES/DRESSINGS) IMPLANT
DURAPREP 26ML APPLICATOR (WOUND CARE) ×3 IMPLANT
FORCEPS CUTTING 33CM 5MM (CUTTING FORCEPS) IMPLANT
FORCEPS CUTTING 45CM 5MM (CUTTING FORCEPS) IMPLANT
GLOVE BIO SURGEON STRL SZ 6.5 (GLOVE) ×3 IMPLANT
GLOVE BIOGEL PI IND STRL 7.0 (GLOVE) ×6 IMPLANT
GLOVE BIOGEL PI INDICATOR 7.0 (GLOVE) ×3
GOWN STRL REUS W/TWL LRG LVL3 (GOWN DISPOSABLE) ×6 IMPLANT
MANIPULATOR UTERINE 4.5 ZUMI (MISCELLANEOUS) ×3 IMPLANT
NS IRRIG 1000ML POUR BTL (IV SOLUTION) ×3 IMPLANT
PACK LAPAROSCOPY BASIN (CUSTOM PROCEDURE TRAY) ×3 IMPLANT
PACK TRENDGUARD 450 HYBRID PRO (MISCELLANEOUS) ×2 IMPLANT
PACK TRENDGUARD 600 HYBRD PROC (MISCELLANEOUS) IMPLANT
POUCH LAPAROSCOPIC INSTRUMENT (MISCELLANEOUS) ×3 IMPLANT
POUCH SPECIMEN RETRIEVAL 10MM (ENDOMECHANICALS) IMPLANT
PROTECTOR NERVE ULNAR (MISCELLANEOUS) ×6 IMPLANT
SCISSORS LAP 5X35 DISP (ENDOMECHANICALS) ×3 IMPLANT
SET IRRIG TUBING LAPAROSCOPIC (IRRIGATION / IRRIGATOR) ×3 IMPLANT
SHEARS HARMONIC ACE PLUS 36CM (ENDOMECHANICALS) IMPLANT
SLEEVE XCEL OPT CAN 5 100 (ENDOMECHANICALS) ×3 IMPLANT
SOLUTION ELECTROLUBE (MISCELLANEOUS) IMPLANT
SPONGE SURGIFOAM ABS GEL 12-7 (HEMOSTASIS) ×3 IMPLANT
SUT MNCRL AB 3-0 PS2 27 (SUTURE) ×3 IMPLANT
SUT VICRYL 0 ENDOLOOP (SUTURE) IMPLANT
SUT VICRYL 0 UR6 27IN ABS (SUTURE) ×6 IMPLANT
TOWEL OR 17X24 6PK STRL BLUE (TOWEL DISPOSABLE) ×6 IMPLANT
TRAY FOLEY CATH SILVER 14FR (SET/KITS/TRAYS/PACK) ×3 IMPLANT
TRENDGUARD 450 HYBRID PRO PACK (MISCELLANEOUS) ×3
TRENDGUARD 600 HYBRID PROC PK (MISCELLANEOUS)
TROCAR BALLN 12MMX100 BLUNT (TROCAR) ×3 IMPLANT
TROCAR XCEL NON-BLD 5MMX100MML (ENDOMECHANICALS) ×3 IMPLANT
WARMER LAPAROSCOPE (MISCELLANEOUS) ×3 IMPLANT
WATER STERILE IRR 1000ML POUR (IV SOLUTION) ×3 IMPLANT

## 2016-05-11 NOTE — Anesthesia Postprocedure Evaluation (Signed)
Anesthesia Post Note  Patient: Dana Liu  Procedure(s) Performed: Procedure(s) (LRB): LAPAROSCOPIC OVARIAN CYSTECTOMY ,DRAINAGE LEFT OVARIAN CYST (Left) INTRAUTERINE DEVICE (IUD) REMOVAL (N/A)  Patient location during evaluation: PACU Anesthesia Type: General Level of consciousness: awake and alert and oriented Pain management: pain level controlled Vital Signs Assessment: post-procedure vital signs reviewed and stable Respiratory status: spontaneous breathing, nonlabored ventilation and respiratory function stable Cardiovascular status: blood pressure returned to baseline and stable Postop Assessment: no signs of nausea or vomiting Anesthetic complications: no        Last Vitals:  Vitals:   05/11/16 1730 05/11/16 1745  BP: (!) 144/80   Pulse: 78 71  Resp: (!) 21 14  Temp:      Last Pain:  Vitals:   05/11/16 1730  TempSrc:   PainSc: 0-No pain   Pain Goal:                 Annagrace Carr A.

## 2016-05-11 NOTE — H&P (Signed)
Dana Liu is an 50 y.o. female. With persistent left ovarian cyst  Pertinent Gynecological History: Menses: flow is light Bleeding: na Contraception: IUD DES exposure: denies Blood transfusions: none Sexually transmitted diseases: no past history Previous GYN Procedures: DNC  Last mammogram: normal Date: 2017 Last pap: normal Date: 2017 OB History: G2, P2   Menstrual History: Menarche age: 66 Patient's last menstrual period was 04/29/2016 (exact date).    Past Medical History:  Diagnosis Date  . Arthritis    left knee  . Heart murmur    as teenager has resolved  . HPV in female   . Hypertension    postpartum  . Hypothyroidism   . Pneumonia   . PONV (postoperative nausea and vomiting)   . Sleep apnea     Past Surgical History:  Procedure Laterality Date  . APPENDECTOMY     1996  . COLPOSCOPY  04/08/2006  . COLPOSCOPY    . KNEE ARTHROSCOPY W/ MENISCECTOMY    . WISDOM TOOTH EXTRACTION      Family History  Problem Relation Age of Onset  . Heart disease Paternal Grandfather   . Diabetes Paternal Grandmother   . Hypertension Paternal Grandmother   . Alcohol abuse Maternal Grandmother   . Macular degeneration Maternal Grandmother   . Sleep apnea Father   . Hypertension Father   . Sleep apnea Mother   . Macular degeneration Mother   . Cancer Paternal Uncle     lung  . Sleep apnea Maternal Uncle   . Heart attack Paternal Aunt     Social History:  reports that she quit smoking about 18 years ago. Her smoking use included Cigarettes. She has a 4.50 pack-year smoking history. She has never used smokeless tobacco. She reports that she drinks about 0.5 oz of alcohol per week . She reports that she does not use drugs.  Allergies: No Known Allergies  Prescriptions Prior to Admission  Medication Sig Dispense Refill Last Dose  . citalopram (CELEXA) 20 MG tablet Take 20 mg by mouth daily.    05/11/2016 at 1000  . fish oil-omega-3 fatty acids 1000 MG capsule  Take 2 g by mouth daily.   Past Month at Unknown time  . guaiFENesin (MUCINEX) 600 MG 12 hr tablet Take 400 mg by mouth 2 (two) times daily.   Past Week at Unknown time  . levonorgestrel (MIRENA) 20 MCG/24HR IUD 1 each by Intrauterine route once.     Marland Kitchen levothyroxine (SYNTHROID, LEVOTHROID) 50 MCG tablet Take 50 mcg by mouth daily before breakfast.    05/11/2016 at 1000  . Multiple Vitamin (MULTIVITAMIN) capsule Take 1 capsule by mouth daily.   Past Month at Unknown time    ROS  Blood pressure (!) 145/92, pulse 67, temperature 98.1 F (36.7 C), temperature source Oral, resp. rate 18, last menstrual period 04/29/2016, SpO2 98 %. Physical Exam  Physical Examination: General appearance - alert, well appearing, and in no distress Heart - normal rate and regular rhythm Abdomen - soft, nontender, nondistended, no masses or organomegaly Pelvic - normal external genitalia, vulva, vagina, cervix, uterus and adnexa Extremities - peripheral pulses normal, no pedal edema, no clubbing or cyanosis  Results for orders placed or performed during the hospital encounter of 05/11/16 (from the past 24 hour(s))  Pregnancy, urine     Status: None   Collection Time: 05/11/16 12:30 PM  Result Value Ref Range   Preg Test, Ur NEGATIVE NEGATIVE    No results found.  Assessment/Plan: Persistent left  ovarian cyst Pt desires removal R&b reviewed She also wants her IUD out Date of Initial H&P: today  History reviewed, patient examined, no change in status, stable for surgery.  [place] A 05/11/2016, 2:14 PM

## 2016-05-11 NOTE — Anesthesia Procedure Notes (Signed)
Procedure Name: Intubation Date/Time: 05/11/2016 2:33 PM Performed by: Tobin Chad Pre-anesthesia Checklist: Patient identified, Emergency Drugs available, Suction available and Patient being monitored Patient Re-evaluated:Patient Re-evaluated prior to inductionOxygen Delivery Method: Circle system utilized and Simple face mask Preoxygenation: Pre-oxygenation with 100% oxygen Intubation Type: IV induction and Inhalational induction Ventilation: Mask ventilation without difficulty Laryngoscope Size: Mac and 3 Grade View: Grade II Tube type: Oral Tube size: 7.0 mm Number of attempts: 1 Airway Equipment and Method: Stylet Placement Confirmation: ETT inserted through vocal cords under direct vision,  positive ETCO2 and breath sounds checked- equal and bilateral Secured at: 22 cm Tube secured with: Tape Dental Injury: Teeth and Oropharynx as per pre-operative assessment

## 2016-05-11 NOTE — Op Note (Addendum)
Diagnostic Laparoscopy Procedure Note  Indications: The patient is a 50 y.o. female with left 9 cm ovarian cyst.  Pre-operative Diagnosis: ovarian cyst, Mirena IUD and pelvic pain  Post-operative Diagnosis:  Same with pelvic abdominal wall adhesiona  Surgeon: [id number]   Assistants: Dr Alesia Richards OB/GYN  Anesthesia: General endotracheal anesthesia  ASA Class: per anesthesia  Procedure Details  The patient was seen in the Holding Room. The risks, benefits, complications, treatment options, and expected outcomes were discussed with the patient. The possibilities of reaction to medication, pulmonary aspiration, perforation of viscus, bleeding, recurrent infection, the need for additional procedures, failure to diagnose a condition, and creating a complication requiring transfusion or operation were discussed with the patient. The patient concurred with the proposed plan, giving informed consent. The patient was taken to the Operating Room, identified as Dana Liu and the procedure verified as Diagnostic Laparoscopy. A Time Out was held and the above information confirmed.  After induction of general anesthesia, the patient was placed in modified dorsal lithotomy position where she was prepped, draped, and catheterized in the normal, sterile fashion.  The cervix was visualized and IUD removed with ring forceps.   an intrauterine manipulator was placed. A 2 cm umbilical incision was then performed. The incision was taken down to the fascia.  The fascia and peritoneum was opened and I was in the peritoneal cavity.  a purse string suture was placed around the fascia. The above findings were noted. Large simple appearing left ovarian cyst, R lower and upper quadrant adhesions.  The right ovary , uterus , liver and bowel appeared normal.  There were adhesions noted in the right quadrant which were lysed using scissors before the right lower trocar was placed  Two small incisions were placed  in the right and left lower quadrants under direct visualization.  A small incision was made on the left ovarian cyst and the fluid was drained and sent for cytology.  The remainder of the fluid was removed with the suction.  The incision was then further opened and a large amount of the cyst with some ovrarian tissue was removed by cutting it away using scissors.  This specimen was placed in the culdesac.  The cyst wall was then removed form the ovary using traction and counter traction.    The ovary was then irrigated and kleppingers were used for hemostasis.  Gelfoam was also placed in the center of the ovary.  The endocatch bag was used through the umbilical port and the ovarian tissue and cyst wall placed in the bag and removed from the abdomen.    Following the procedure the umbilical sheath was removed after intra-abdominal carbon dioxide was expressed. The fascia was closed by tying the purse string.  The skin was closed in a subcuticular fashion.  The small incisions were closed with dermabond.    Instrument, sponge, and needle counts were correct prior to abdominal closure and at the conclusion of the case.   Findings: See above  Estimated Blood Loss:  less than 100 mL         Drains: na         Total IV Fluids: 1555mL         Specimens: cyst wall and ovarian tissue              Complications:  None; patient tolerated the procedure well.         Disposition: PACU - hemodynamically stable.  Condition: stable

## 2016-05-11 NOTE — Transfer of Care (Signed)
Immediate Anesthesia Transfer of Care Note  Patient: Sundae Berth  Procedure(s) Performed: Procedure(s): LAPAROSCOPIC OVARIAN CYSTECTOMY ,DRAINAGE LEFT OVARIAN CYST (Left) INTRAUTERINE DEVICE (IUD) REMOVAL (N/A)  Patient Location: PACU  Anesthesia Type:General  Level of Consciousness: awake, alert , oriented and patient cooperative  Airway & Oxygen Therapy: Patient Spontanous Breathing and Patient connected to nasal cannula oxygen  Post-op Assessment: Report given to RN and Post -op Vital signs reviewed and stable  Post vital signs: Reviewed and stable 143/82, SR 83, 95% spo2 on 2L [place]  Last Vitals:  Vitals:   05/11/16 1235 05/11/16 1707  BP: (!) 145/92 (!) 143/82  Pulse: 67 82  Resp: 18 16  Temp: 36.7 C 37.3 C    Last Pain:  Vitals:   05/11/16 1235  TempSrc: Oral         Complications: No apparent anesthesia complications

## 2016-05-11 NOTE — Anesthesia Preprocedure Evaluation (Addendum)
Anesthesia Evaluation  Patient identified by MRN, date of birth, ID band Patient awake    Reviewed: Allergy & Precautions, NPO status , Patient's Chart, lab work & pertinent test results  History of Anesthesia Complications (+) PONV and history of anesthetic complications  Airway Mallampati: III  TM Distance: >3 FB Neck ROM: Full    Dental no notable dental hx. (+) Teeth Intact, Caps   Pulmonary sleep apnea and Continuous Positive Airway Pressure Ventilation , pneumonia, resolved, former smoker,    Pulmonary exam normal breath sounds clear to auscultation       Cardiovascular hypertension, Normal cardiovascular exam+ Valvular Problems/Murmurs  Rhythm:Regular Rate:Normal     Neuro/Psych Depression negative neurological ROS     GI/Hepatic Neg liver ROS,   Endo/Other  Hypothyroidism Obesity  Renal/GU negative Renal ROS  negative genitourinary   Musculoskeletal  (+) Arthritis ,   Abdominal   Peds  Hematology   Anesthesia Other Findings   Reproductive/Obstetrics Left ovarian cyst HPV                            Lab Results  Component Value Date   WBC 9.9 05/03/2016   HGB 14.8 05/03/2016   HCT 43.5 05/03/2016   MCV 89.7 05/03/2016   PLT 277 05/03/2016    Anesthesia Physical Anesthesia Plan  ASA: II  Anesthesia Plan: General   Post-op Pain Management:    Induction: Intravenous  Airway Management Planned: Oral ETT  Additional Equipment:   Intra-op Plan:   Post-operative Plan: Extubation in OR  Informed Consent: I have reviewed the patients History and Physical, chart, labs and discussed the procedure including the risks, benefits and alternatives for the proposed anesthesia with the patient or authorized representative who has indicated his/her understanding and acceptance.   Dental advisory given  Plan Discussed with: Anesthesiologist, CRNA and Surgeon  Anesthesia Plan  Comments:         Anesthesia Quick Evaluation

## 2016-05-11 NOTE — Discharge Instructions (Signed)
DISCHARGE INSTRUCTIONS: Laparoscopy  The following instructions have been prepared to help you care for yourself upon your return home today.  Wound care:  Do not get the incision wet for the first 24 hours. The incision should be kept clean and dry.  The Band-Aids or dressings may be removed the day after surgery.  Should the incision become sore, red, and swollen after the first week, check with your doctor.  Personal hygiene:  Shower the day after your procedure.  Activity and limitations:  Do NOT drive or operate any equipment today.  Do NOT lift anything more than 15 pounds for 2-3 weeks after surgery.  Do NOT rest in bed all day.  Walking is encouraged. Walk each day, starting slowly with 5-minute walks 3 or 4 times a day. Slowly increase the length of your walks.  Walk up and down stairs slowly.  Do NOT do strenuous activities, such as golfing, playing tennis, bowling, running, biking, weight lifting, gardening, mowing, or vacuuming for 2-4 weeks. Ask your doctor when it is okay to start.  Diet: Eat a light meal as desired this evening. You may resume your usual diet tomorrow.  Return to work: This is dependent on the type of work you do. For the most part you can return to a desk job within a week of surgery. If you are more active at work, please discuss this with your doctor.  What to expect after your surgery: You may have a slight burning sensation when you urinate on the first day. You may have a very small amount of blood in the urine. Expect to have a small amount of vaginal discharge/light bleeding for 1-2 weeks. It is not unusual to have abdominal soreness and bruising for up to 2 weeks. You may be tired and need more rest for about 1 week. You may experience shoulder pain for 24-72 hours. Lying flat in bed may relieve it.  Call your doctor for any of the following:  Develop a fever of 100.4 or greater  Inability to urinate 6 hours after discharge from  hospital  Severe pain not relieved by pain medications  Persistent of heavy bleeding at incision site  Redness or swelling around incision site after a week  Increasing nausea or vomiting  Patient Signature________________________________________ Nurse Signature_________________________________________ Post Anesthesia Home Care Instructions  Activity: Get plenty of rest for the remainder of the day. A responsible adult should stay with you for 24 hours following the procedure.  For the next 24 hours, DO NOT: -Drive a car -Paediatric nurse -Drink alcoholic beverages -Take any medication unless instructed by your physician -Make any legal decisions or sign important papers.  Meals: Start with liquid foods such as gelatin or soup. Progress to regular foods as tolerated. Avoid greasy, spicy, heavy foods. If nausea and/or vomiting occur, drink only clear liquids until the nausea and/or vomiting subsides. Call your physician if vomiting continues.  Special Instructions/Symptoms: Your throat may feel dry or sore from the anesthesia or the breathing tube placed in your throat during surgery. If this causes discomfort, gargle with warm salt water. The discomfort should disappear within 24 hours.  If you had a scopolamine patch placed behind your ear for the management of post- operative nausea and/or vomiting:  1. The medication in the patch is effective for 72 hours, after which it should be removed.  Wrap patch in a tissue and discard in the trash. Wash hands thoroughly with soap and water. 2. You may remove the patch  earlier than 72 hours if you experience unpleasant side effects which may include dry mouth, dizziness or visual disturbances. 3. Avoid touching the patch. Wash your hands with soap and water after contact with the patch.   Diagnostic Laparoscopy A diagnostic laparoscopy is a procedure to diagnose diseases in the abdomen. During the procedure, a thin, lighted, pencil-sized  instrument called a laparoscope is inserted into the abdomen through an incision. The laparoscope allows your health care provider to look at the organs inside your body. Tell a health care provider about:  Any allergies you have.  All medicines you are taking, including vitamins, herbs, eye drops, creams, and over-the-counter medicines.  Any problems you or family members have had with anesthetic medicines.  Any blood disorders you have.  Any surgeries you have had.  Any medical conditions you have. What are the risks? Generally, this is a safe procedure. However, problems can occur, which may include:  Infection.  Bleeding.  Damage to other organs.  Allergic reaction to the anesthetics used during the procedure. What happens before the procedure?  Do not eat or drink anything after midnight on the night before the procedure or as directed by your health care provider.  Ask your health care provider about:  Changing or stopping your regular medicines.  Taking medicines such as aspirin and ibuprofen. These medicines can thin your blood. Do not take these medicines before your procedure if your health care provider instructs you not to.  Plan to have someone take you home after the procedure. What happens during the procedure?  You may be given a medicine to help you relax (sedative).  You will be given a medicine to make you sleep (general anesthetic).  Your abdomen will be inflated with a gas. This will make your organs easier to see.  Small incisions will be made in your abdomen.  A laparoscope and other small instruments will be inserted into the abdomen through the incisions.  A tissue sample may be removed from an organ in the abdomen for examination.  The instruments will be removed from the abdomen.  The gas will be released.  The incisions will be closed with stitches (sutures). What happens after the procedure? Your blood pressure, heart rate, breathing  rate, and blood oxygen level will be monitored often until the medicines you were given have worn off. This information is not intended to replace advice given to you by your health care provider. Make sure you discuss any questions you have with your health care provider. Document Released: 08/06/2000 Document Revised: 09/08/2015 Document Reviewed: 12/11/2013 Elsevier Interactive Patient Education  2017 [employer].

## 2016-05-12 ENCOUNTER — Encounter (HOSPITAL_COMMUNITY): Payer: Self-pay | Admitting: Obstetrics and Gynecology

## 2019-03-16 ENCOUNTER — Other Ambulatory Visit: Payer: Self-pay

## 2019-03-16 DIAGNOSIS — Z20822 Contact with and (suspected) exposure to covid-19: Secondary | ICD-10-CM

## 2019-03-17 LAB — NOVEL CORONAVIRUS, NAA: SARS-CoV-2, NAA: NOT DETECTED

## 2019-04-07 ENCOUNTER — Other Ambulatory Visit: Payer: Self-pay | Admitting: Obstetrics and Gynecology

## 2019-04-07 DIAGNOSIS — R928 Other abnormal and inconclusive findings on diagnostic imaging of breast: Secondary | ICD-10-CM

## 2019-04-16 ENCOUNTER — Ambulatory Visit: Payer: BC Managed Care – PPO

## 2019-04-16 ENCOUNTER — Ambulatory Visit
Admission: RE | Admit: 2019-04-16 | Discharge: 2019-04-16 | Disposition: A | Discharge status: Home / Self Care | Payer: BC Managed Care – PPO | Source: Ambulatory Visit | Attending: Obstetrics and Gynecology | Admitting: Obstetrics and Gynecology

## 2019-04-16 ENCOUNTER — Other Ambulatory Visit: Payer: Self-pay

## 2019-04-16 DIAGNOSIS — R928 Other abnormal and inconclusive findings on diagnostic imaging of breast: Secondary | ICD-10-CM

## 2019-07-09 ENCOUNTER — Ambulatory Visit: Payer: BC Managed Care – PPO

## 2020-05-06 IMAGING — MG MM DIGITAL DIAGNOSTIC UNILAT*R* W/ TOMO W/ CAD
6 series · 6 of 18 positions shown · non-contrast
Comparison: 04/01/2019 and earlier

CLINICAL DATA: The patient presents after screening study for
evaluation of possible RIGHT breast asymmetry.

EXAM:
DIGITAL DIAGNOSTIC UNILATERAL RIGHT MAMMOGRAM WITH CAD AND TOMO

[R MLO synth-2D (1 of 2)]
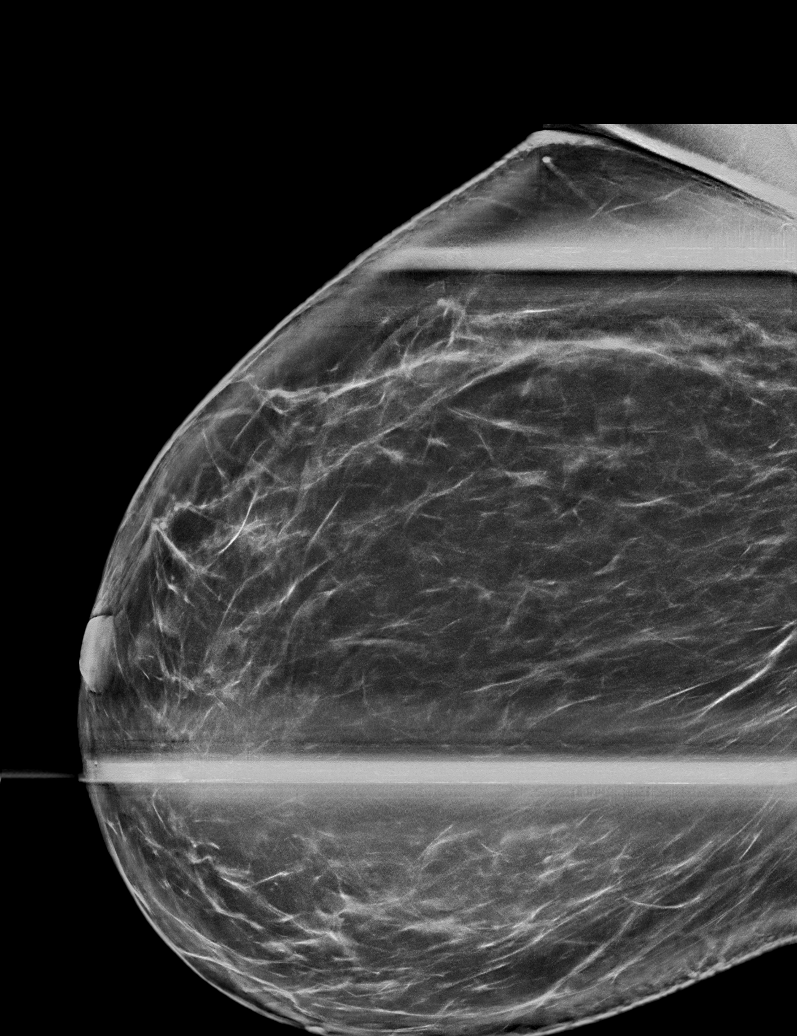

[R MLO synth-2D (2 of 2)]
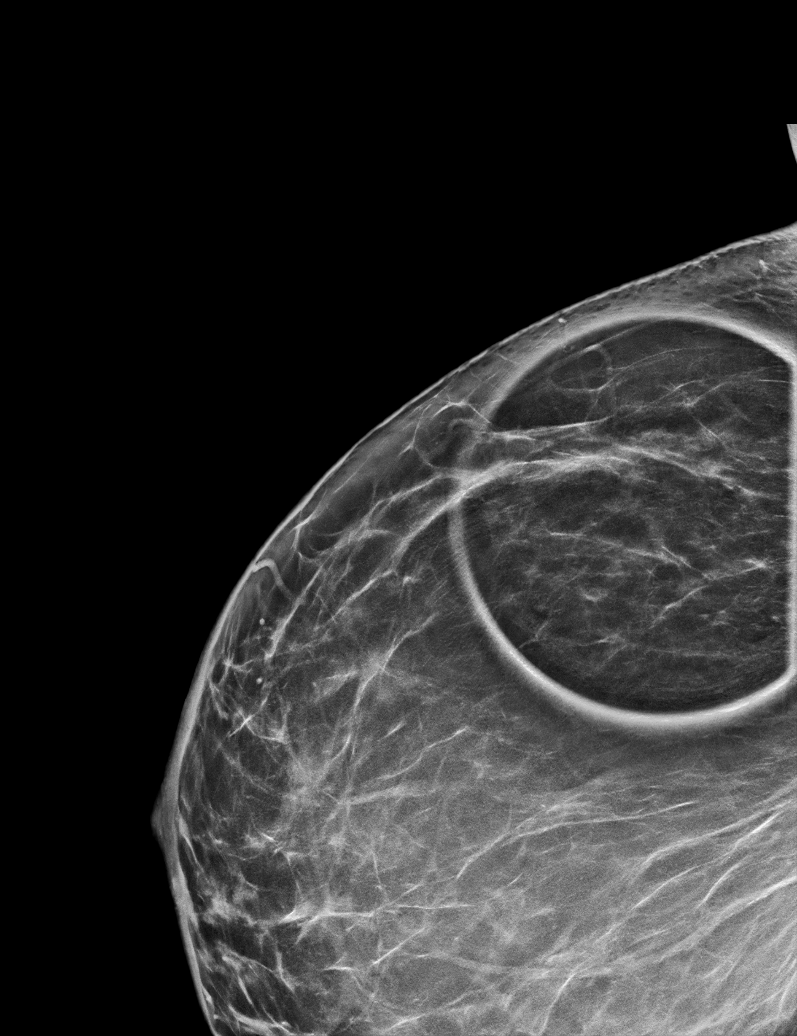

[R ML synth-2D]
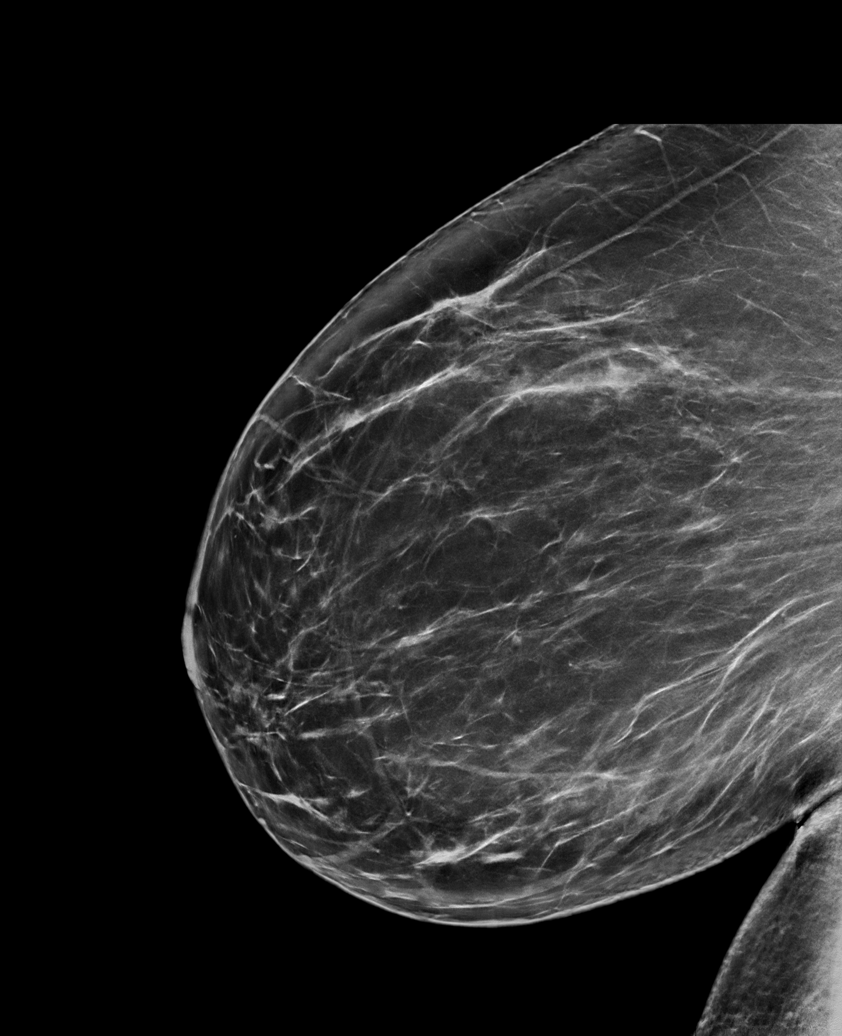

[R MLO tomo (1 of 2) · tomo slice 40/79.0]
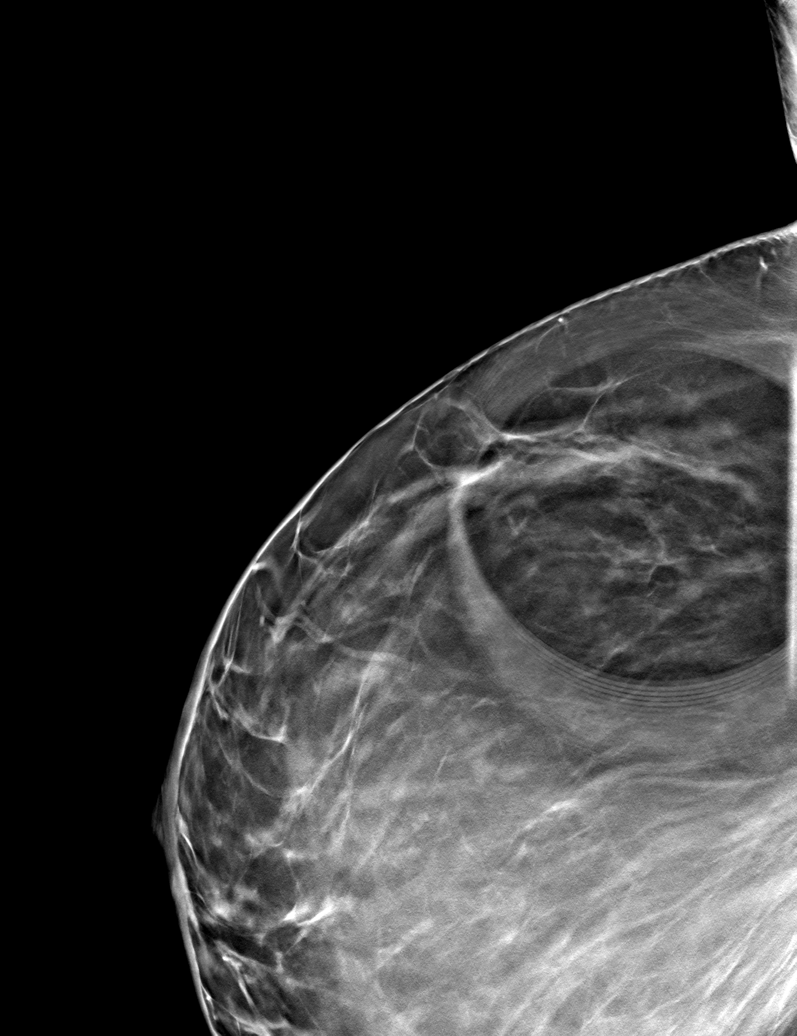

[R MLO tomo (2 of 2) · tomo slice 47/94.0]
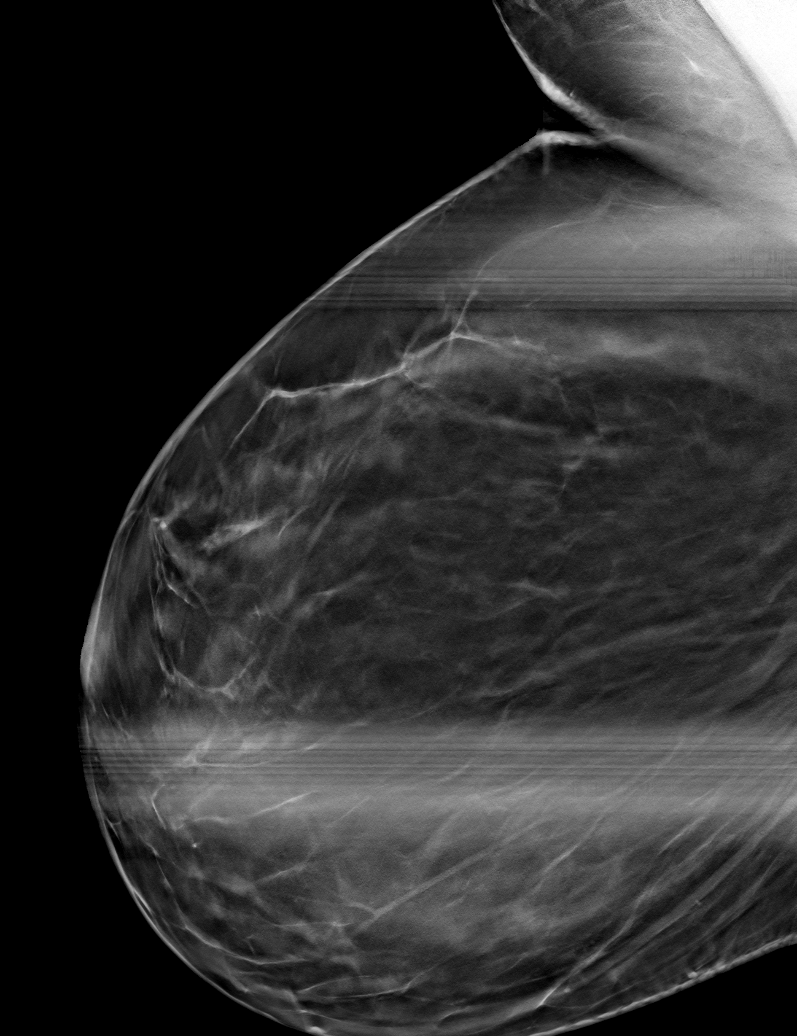

[R ML tomo · tomo slice 45/90.0]
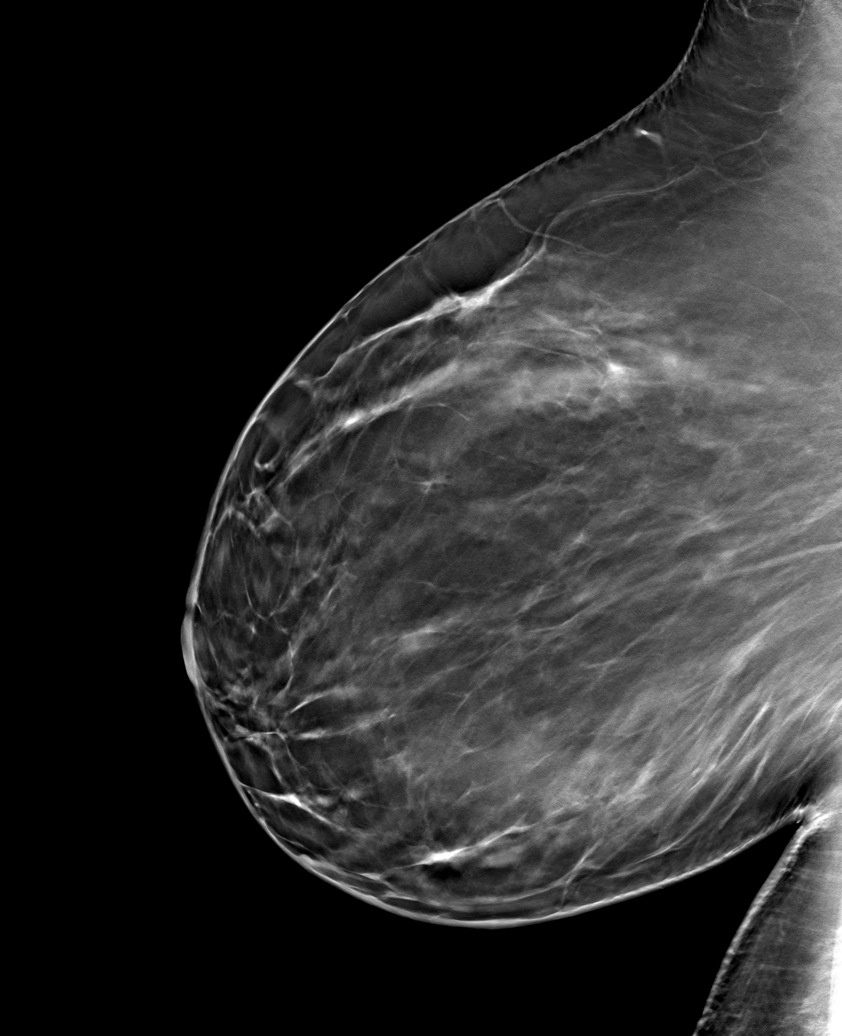

[6 of 18 positions shown; findings below may reference images not displayed]

ACR Breast Density Category b: There are scattered areas of
fibroglandular density.
FINDINGS: Additional 2-D and 3-D images are performed. These views show no
persistent mass or asymmetry in the UPPER portion of the RIGHT
breast. No suspicious mass, distortion, or microcalcifications are
identified to suggest presence of malignancy.

Mammographic images were processed with CAD.
IMPRESSION: No mammographic evidence for malignancy.

RECOMMENDATION:
Screening mammogram in one year.(Code:MA-6-UB4)

I have discussed the findings and recommendations with the patient.
If applicable, a reminder letter will be sent to the patient
regarding the next appointment.

BI-RADS CATEGORY  1: Negative.

## 2020-09-09 ENCOUNTER — Other Ambulatory Visit: Payer: Self-pay | Admitting: Obstetrics and Gynecology

## 2020-09-20 ENCOUNTER — Other Ambulatory Visit: Payer: Self-pay | Admitting: Obstetrics and Gynecology

## 2020-09-20 DIAGNOSIS — R109 Unspecified abdominal pain: Secondary | ICD-10-CM

## 2020-10-12 ENCOUNTER — Ambulatory Visit
Admission: RE | Admit: 2020-10-12 | Discharge: 2020-10-12 | Disposition: A | Discharge status: Home / Self Care | Payer: BC Managed Care – PPO | Source: Ambulatory Visit | Attending: Obstetrics and Gynecology | Admitting: Obstetrics and Gynecology

## 2020-10-12 ENCOUNTER — Other Ambulatory Visit: Payer: Self-pay

## 2020-10-12 DIAGNOSIS — R109 Unspecified abdominal pain: Secondary | ICD-10-CM

## 2020-11-30 ENCOUNTER — Other Ambulatory Visit: Payer: Self-pay

## 2020-11-30 ENCOUNTER — Encounter: Payer: Self-pay | Admitting: Gynecologic Oncology

## 2020-11-30 ENCOUNTER — Inpatient Hospital Stay: Payer: BC Managed Care – PPO | Attending: Gynecologic Oncology | Admitting: Gynecologic Oncology

## 2020-11-30 VITALS — BP 146/90 | HR 65 | Temp 98.1°F | Resp 18 | Ht 65.0 in | Wt 274.0 lb

## 2020-11-30 DIAGNOSIS — F419 Anxiety disorder, unspecified: Secondary | ICD-10-CM | POA: Diagnosis not present

## 2020-11-30 DIAGNOSIS — N888 Other specified noninflammatory disorders of cervix uteri: Secondary | ICD-10-CM

## 2020-11-30 DIAGNOSIS — Z6841 Body Mass Index (BMI) 40.0 and over, adult: Secondary | ICD-10-CM | POA: Diagnosis not present

## 2020-11-30 DIAGNOSIS — D390 Neoplasm of uncertain behavior of uterus: Secondary | ICD-10-CM | POA: Diagnosis present

## 2020-11-30 DIAGNOSIS — I10 Essential (primary) hypertension: Secondary | ICD-10-CM | POA: Insufficient documentation

## 2020-11-30 DIAGNOSIS — E039 Hypothyroidism, unspecified: Secondary | ICD-10-CM | POA: Diagnosis not present

## 2020-11-30 DIAGNOSIS — F32A Depression, unspecified: Secondary | ICD-10-CM | POA: Insufficient documentation

## 2020-11-30 DIAGNOSIS — Z79899 Other long term (current) drug therapy: Secondary | ICD-10-CM | POA: Diagnosis not present

## 2020-11-30 DIAGNOSIS — R011 Cardiac murmur, unspecified: Secondary | ICD-10-CM | POA: Diagnosis not present

## 2020-11-30 DIAGNOSIS — M1712 Unilateral primary osteoarthritis, left knee: Secondary | ICD-10-CM | POA: Diagnosis not present

## 2020-11-30 NOTE — Progress Notes (Signed)
Consult Note: Gyn-Onc  Consult was requested by Dr. Charlesetta Garibaldi for the evaluation of Dana Liu 55 y.o. female  CC:  Chief Complaint  Patient presents with   Cervical mass    Assessment/Plan:  Ms. Dana Liu  is a 55 y.o.  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  The cervix is palpably and visibly normal on examination. Recent paps and HPV testing have been negative.  I reassured the patient that I had a low suspicion that this cystic mass represented a malignancy.  I explained the possibility for enter rater reliability issues with repeated ultrasounds.  I explained that given its size, any increase in dimensions were likely to be subtle compared to the prior ultrasound.  I explained that the most conservative option would be to perform an MRI of the pelvis at present to better evaluate the soft tissue consistency of the cervix and the mass and to rule out any solid areas.  However I did not feel strongly that this was necessary given my physical exam findings today my low suspicion for malignancy.  An alternative option would be to repeat the ultrasound scan in 6 months to evaluate for any significant changes such as growth in size greater than 50%, or increasing solid areas.  The patient is electing for the latter (repeat ultrasound with Dr. Charlesetta Garibaldi in 6 months time).  She will follow-up with Dr. Mathis Fare to schedule this appointment.  I counseled the patient regarding her increased risk for endometrial cancer given her obesity.  I counseled her that abnormal uterine bleeding should be evaluated with endometrial sampling.  At present her thin endometrial stripe is reassuring, and I do not recommend sampling at this time.   HPI: Ms Dana Liu is a 55 year old P2 who was seen in consultation at the request of Dr Charlesetta Garibaldi for evaluation of a complex cystic mass within the cervix.  The patient reported  development of crampy pelvic pain in April and May 2022.  Prior to that her last menstrual period had been in 2021 (October).  As part of the work-up for this she underwent MRI of the pelvis with her orthopedic surgeon who identified arthritis in the low back and hips.  However due to the presence of anterior pelvic pain she also followed up with her established gynecologist, Dr. Charlesetta Garibaldi, and an ultrasound scan was performed in May.  This identified a cystic mass within the cervix consistent with a nabothian cyst.  A repeat ultrasound scan was performed on 11/16/2020 which identified a anteverted uterus measuring 9.4 cm from fundus to external os (5.7 x 4.4 x 4.9 cm at the fundus.  The endometrium was 4.47 mm).  The left ovary was not visualized.  The right ovary appeared normal.  There were no adnexal cysts.  In the posterior cervix there was an intramural mass that contain mixed echogenicity and had increased in size since the prior ultrasound now measuring 3 x 2.2 x 3.2 cm.  No prior dimensions were provided.  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  The patient was offered follow-up ultrasound in 6 months we will consultation with GYN oncology, and she elected for the latter.  Her gynecologic history is remarkable for an abnormal Pap smear HPV related in 2007 which was treated with colposcopy and biopsies but did not require an excisional procedure.  Subsequent cervical cytology testing was negative.  Her medical history is  most significant for morbid obesity with a BMI of 46 kg per metered squared, anxiety and depression, arthritis, hypothyroidism.  Her surgical history is most significant for an open appendectomy in her early 38s, a laparoscopic ovarian cystectomy in 2017 with Dr. Charlesetta Garibaldi for benign pathology.  Her gynecologic history is remarkable for a benign ovarian cyst in 2017, abnormal Pap smear in 2007, 2 prior vaginal deliveries.  Her last menstrual period began  on November 28, 2020 after her preceding 91-month interval of no menses.  This vaginal bleeding was consistent with normal menstrual flow.  She has menopausal symptoms.  Her family cancer history is unremarkable.   Current Meds:  Outpatient Encounter Medications as of 11/30/2020  Medication Sig   Calcium 600-200 MG-UNIT tablet Take 1 tablet by mouth daily.   gabapentin (NEURONTIN) 300 MG capsule Take 300 mg by mouth daily as needed.   levothyroxine (SYNTHROID, LEVOTHROID) 50 MCG tablet Take 50 mcg by mouth daily before breakfast.    losartan (COZAAR) 100 MG tablet Take 100 mg by mouth daily.   metoprolol succinate (TOPROL-XL) 50 MG 24 hr tablet Take 50 mg by mouth daily.   Multiple Vitamin (MULTIVITAMIN) capsule Take 1 capsule by mouth daily.   naproxen sodium (ANAPROX) 550 MG tablet Take 550 mg by mouth daily as needed.   venlafaxine XR (EFFEXOR-XR) 150 MG 24 hr capsule Take 150 mg by mouth daily.   [DISCONTINUED] fish oil-omega-3 fatty acids 1000 MG capsule Take 2 g by mouth daily.   [DISCONTINUED] citalopram (CELEXA) 20 MG tablet Take 20 mg by mouth daily.    [DISCONTINUED] guaiFENesin (MUCINEX) 600 MG 12 hr tablet Take 400 mg by mouth 2 (two) times daily.   [DISCONTINUED] HYDROcodone-acetaminophen (NORCO/VICODIN) 5-325 MG tablet Take 1 tablet by mouth every 6 (six) hours as needed.   [DISCONTINUED] ibuprofen (ADVIL,MOTRIN) 600 MG tablet Take 1 tablet (600 mg total) by mouth every 6 (six) hours as needed.   [DISCONTINUED] levonorgestrel (MIRENA) 20 MCG/24HR IUD 1 each by Intrauterine route once.   [DISCONTINUED] promethazine (PHENERGAN) 12.5 MG tablet Take 1 tablet (12.5 mg total) by mouth every 6 (six) hours as needed for nausea or vomiting.   No facility-administered encounter medications on file as of 11/30/2020.    Allergy: No Known Allergies  Social Hx:   Social History   Socioeconomic History   Marital status: Married    Spouse name: Not on file   Number of children: Not on file    Years of education: Not on file   Highest education level: Not on file  Occupational History   Not on file  Tobacco Use   Smoking status: Former    Packs/day: 0.30    Years: 15.00    Pack years: 4.50    Types: Cigarettes    Quit date: 05/16/1998    Years since quitting: 22.5   Smokeless tobacco: Never  Vaping Use   Vaping Use: Never used  Substance and Sexual Activity   Alcohol use: Yes    Alcohol/week: 1.0 standard drink    Types: 1 Standard drinks or equivalent per week    Comment: occasional glass of wine   Drug use: No    Comment: Marijuana in the 1980's   Sexual activity: Not Currently  Other Topics Concern   Not on file  Social History Narrative   Not on file   Social Determinants of Health   Financial Resource Strain: Not on file  Food Insecurity: Not on file  Transportation Needs: Not on  file  Physical Activity: Not on file  Stress: Not on file  Social Connections: Not on file  Intimate Partner Violence: Not on file    Past Surgical Hx:  Past Surgical History:  Procedure Laterality Date   APPENDECTOMY     1996   COLPOSCOPY  04/08/2006   COLPOSCOPY     IUD REMOVAL N/A 05/11/2016   Procedure: INTRAUTERINE DEVICE (IUD) REMOVAL;  Surgeon: Crawford Givens, MD;  Location: [facility];  Service: Gynecology;  Laterality: N/A;   KNEE ARTHROSCOPY W/ MENISCECTOMY     LAPAROSCOPIC OVARIAN CYSTECTOMY Left 05/11/2016   Procedure: LAPAROSCOPIC OVARIAN CYSTECTOMY ,DRAINAGE LEFT OVARIAN CYST;  Surgeon: Crawford Givens, MD;  Location: [facility];  Service: Gynecology;  Laterality: Left;   WISDOM TOOTH EXTRACTION      Past Medical Hx:  Past Medical History:  Diagnosis Date   Arthritis    left knee   Heart murmur    as teenager has resolved   HPV in female    Hypertension    postpartum   Hypothyroidism    Morbid obesity ([place])    Pneumonia    PONV (postoperative nausea and vomiting)    Sleep apnea     Past Gynecological History:  see HPI No LMP recorded.  Family Hx:   Family History  Problem Relation Age of Onset   Sleep apnea Mother    Macular degeneration Mother    Sleep apnea Father    Hypertension Father    Sleep apnea Maternal Uncle    Heart attack Paternal Aunt    Lung cancer Paternal Uncle    Alcohol abuse Maternal Grandmother    Macular degeneration Maternal Grandmother    Diabetes Paternal Grandmother    Hypertension Paternal Grandmother    Heart disease Paternal Grandfather    Breast cancer Neg Hx    Colon cancer Neg Hx    Ovarian cancer Neg Hx    Endometrial cancer Neg Hx    Prostate cancer Neg Hx    Pancreatic cancer Neg Hx     Review of Systems:  Constitutional  Feels well,    ENT Normal appearing ears and nares bilaterally Skin/Breast  No rash, sores, jaundice, itching, dryness Cardiovascular  No chest pain, shortness of breath, or edema  Pulmonary  No cough or wheeze.  Gastro Intestinal  No nausea, vomitting, or diarrhoea. No bright red blood per rectum, no abdominal pain, change in bowel movement, or constipation.  Genito Urinary  No frequency, urgency, dysuria, + irregular bleeding consistent with perimenopause Musculo Skeletal  No myalgia, arthralgia, joint swelling or pain  Neurologic  No weakness, numbness, change in gait,  Psychology  No depression, anxiety, insomnia.   Vitals:  Blood pressure (!) 146/90, pulse 65, temperature 98.1 F (36.7 C), temperature source Tympanic, resp. rate 18, height 5\' 5"  (1.651 m), weight 274 lb (124.3 kg), SpO2 95 %.  Physical Exam: WD in NAD Neck  Supple NROM, without any enlargements.  Lymph Node Survey No cervical supraclavicular or inguinal adenopathy Cardiovascular  Well perfused peripheries Lungs  No increased WOB Skin  No rash/lesions/breakdown  Psychiatry  Alert and oriented to person, place, and time  Abdomen  Normoactive bowel sounds, abdomen soft, non-tender and obese without evidence of hernia. No palpable masses Back No CVA tenderness Genito Urinary   Vulva/vagina: Normal external female genitalia.  No lesions. No discharge or bleeding.  Bladder/urethra:  No lesions or masses, well supported bladder  Vagina: normal  Cervix: Normal appearing, no lesions.  Uterus:  Small, mobile, no parametrial involvement or nodularity.  Adnexa: no palpable masses. Rectal  deferred Extremities  No bilateral cyanosis, clubbing or edema.  60 minutes of total time was spent for this patient encounter, including preparation, face-to-face counseling with the patient and coordination of care, review of imaging (results and images), communication with the referring provider and documentation of the encounter.   Thereasa Solo, MD  11/30/2020, 10:19 AM

## 2020-11-30 NOTE — Patient Instructions (Signed)
Dr Denman George has a low suspicion that this cyst in the cervix is cancerous. It is reasonable to repeat an ultrasound in 6 months to monitor for any significant change (growth by more than 50% or increased solid areas). An alternative would be an MRI now, however, she does not feel strongly about this and it can be a costly study.  If you develop abnormal bleeding symptoms, please notify Dr Charlesetta Garibaldi and enquire about an endometrial biopsy, as you are at higher risk for developing uterine cancer, and these are the symptoms of uterine cancer.  If you have any questions for Dr Denman George, please call her office at [phone].

## 2021-01-31 DIAGNOSIS — F419 Anxiety disorder, unspecified: Secondary | ICD-10-CM | POA: Insufficient documentation

## 2021-04-25 ENCOUNTER — Other Ambulatory Visit (HOSPITAL_COMMUNITY): Payer: Self-pay | Admitting: Gastroenterology

## 2021-04-25 ENCOUNTER — Other Ambulatory Visit: Payer: Self-pay | Admitting: Gastroenterology

## 2021-04-25 DIAGNOSIS — R1011 Right upper quadrant pain: Secondary | ICD-10-CM

## 2021-05-05 ENCOUNTER — Encounter (HOSPITAL_COMMUNITY)
Admission: RE | Admit: 2021-05-05 | Discharge: 2021-05-05 | Disposition: A | Discharge status: Home / Self Care | Payer: BC Managed Care – PPO | Source: Ambulatory Visit | Attending: Gastroenterology | Admitting: Gastroenterology

## 2021-05-05 ENCOUNTER — Ambulatory Visit (HOSPITAL_COMMUNITY)
Admission: RE | Admit: 2021-05-05 | Discharge: 2021-05-05 | Disposition: A | Discharge status: Home / Self Care | Payer: BC Managed Care – PPO | Source: Ambulatory Visit | Attending: Gastroenterology | Admitting: Gastroenterology

## 2021-05-05 ENCOUNTER — Other Ambulatory Visit: Payer: Self-pay

## 2021-05-05 DIAGNOSIS — R1011 Right upper quadrant pain: Secondary | ICD-10-CM

## 2021-05-05 MED ORDER — TECHNETIUM TC 99M MEBROFENIN IV KIT
5.5000 | PACK | Freq: Once | INTRAVENOUS | Status: AC
  Administered 2021-05-05: 09:00:00 5.5 via INTRAVENOUS

## 2021-07-02 DIAGNOSIS — R002 Palpitations: Secondary | ICD-10-CM | POA: Diagnosis not present

## 2021-09-05 DIAGNOSIS — Z0289 Encounter for other administrative examinations: Secondary | ICD-10-CM

## 2021-09-12 ENCOUNTER — Encounter (INDEPENDENT_AMBULATORY_CARE_PROVIDER_SITE_OTHER): Payer: Self-pay | Admitting: Bariatrics

## 2021-09-12 ENCOUNTER — Ambulatory Visit (INDEPENDENT_AMBULATORY_CARE_PROVIDER_SITE_OTHER): Payer: BC Managed Care – PPO | Admitting: Bariatrics

## 2021-09-12 VITALS — BP 117/72 | HR 70 | Temp 98.3°F | Ht 65.0 in | Wt 260.0 lb

## 2021-09-12 DIAGNOSIS — E559 Vitamin D deficiency, unspecified: Secondary | ICD-10-CM

## 2021-09-12 DIAGNOSIS — E668 Other obesity: Secondary | ICD-10-CM

## 2021-09-12 DIAGNOSIS — E038 Other specified hypothyroidism: Secondary | ICD-10-CM | POA: Diagnosis not present

## 2021-09-12 DIAGNOSIS — I10 Essential (primary) hypertension: Secondary | ICD-10-CM | POA: Diagnosis not present

## 2021-09-12 DIAGNOSIS — Z9989 Dependence on other enabling machines and devices: Secondary | ICD-10-CM

## 2021-09-12 DIAGNOSIS — R0602 Shortness of breath: Secondary | ICD-10-CM

## 2021-09-12 DIAGNOSIS — G4733 Obstructive sleep apnea (adult) (pediatric): Secondary | ICD-10-CM | POA: Diagnosis not present

## 2021-09-12 DIAGNOSIS — K580 Irritable bowel syndrome with diarrhea: Secondary | ICD-10-CM

## 2021-09-12 DIAGNOSIS — Z1331 Encounter for screening for depression: Secondary | ICD-10-CM | POA: Diagnosis not present

## 2021-09-12 DIAGNOSIS — Z6841 Body Mass Index (BMI) 40.0 and over, adult: Secondary | ICD-10-CM

## 2021-09-12 DIAGNOSIS — K760 Fatty (change of) liver, not elsewhere classified: Secondary | ICD-10-CM

## 2021-09-12 DIAGNOSIS — R7309 Other abnormal glucose: Secondary | ICD-10-CM

## 2021-09-12 DIAGNOSIS — R5383 Other fatigue: Secondary | ICD-10-CM

## 2021-09-13 LAB — COMPREHENSIVE METABOLIC PANEL
ALT: 56 IU/L — ABNORMAL HIGH (ref 0–32)
AST: 37 IU/L (ref 0–40)
Albumin/Globulin Ratio: 2.1 (ref 1.2–2.2)
Albumin: 4.4 g/dL (ref 3.8–4.9)
Alkaline Phosphatase: 107 IU/L (ref 44–121)
BUN/Creatinine Ratio: 19 (ref 9–23)
BUN: 12 mg/dL (ref 6–24)
Bilirubin Total: 0.4 mg/dL (ref 0.0–1.2)
CO2: 26 mmol/L (ref 20–29)
Calcium: 11 mg/dL — ABNORMAL HIGH (ref 8.7–10.2)
Chloride: 102 mmol/L (ref 96–106)
Creatinine, Ser: 0.63 mg/dL (ref 0.57–1.00)
Globulin, Total: 2.1 g/dL (ref 1.5–4.5)
Glucose: 87 mg/dL (ref 70–99)
Potassium: 4.5 mmol/L (ref 3.5–5.2)
Sodium: 140 mmol/L (ref 134–144)
Total Protein: 6.5 g/dL (ref 6.0–8.5)
eGFR: 106 mL/min/{1.73_m2} (ref >59)

## 2021-09-13 LAB — INSULIN, RANDOM: INSULIN: 21.9 u[IU]/mL (ref 2.6–24.9)

## 2021-09-13 LAB — LIPID PANEL WITH LDL/HDL RATIO
Cholesterol, Total: 179 mg/dL (ref 100–199)
HDL: 49 mg/dL (ref >39)
LDL Chol Calc (NIH): 102 mg/dL — ABNORMAL HIGH (ref 0–99)
LDL/HDL Ratio: 2.1 ratio (ref 0.0–3.2)
Triglycerides: 159 mg/dL — ABNORMAL HIGH (ref 0–149)
VLDL Cholesterol Cal: 28 mg/dL (ref 5–40)

## 2021-09-13 LAB — HEMOGLOBIN A1C
Est. average glucose Bld gHb Est-mCnc: 111 mg/dL
Hgb A1c MFr Bld: 5.5 % (ref 4.8–5.6)

## 2021-09-13 LAB — VITAMIN D 25 HYDROXY (VIT D DEFICIENCY, FRACTURES): Vit D, 25-Hydroxy: 33.5 ng/mL (ref 30.0–100.0)

## 2021-09-13 LAB — TSH+T4F+T3FREE
Free T4: 1.12 ng/dL (ref 0.82–1.77)
T3, Free: 3.1 pg/mL (ref 2.0–4.4)
TSH: 2.96 u[IU]/mL (ref 0.450–4.500)

## 2021-09-14 ENCOUNTER — Encounter (INDEPENDENT_AMBULATORY_CARE_PROVIDER_SITE_OTHER): Payer: Self-pay | Admitting: Bariatrics

## 2021-09-14 DIAGNOSIS — E8881 Metabolic syndrome: Secondary | ICD-10-CM | POA: Insufficient documentation

## 2021-09-14 DIAGNOSIS — R7401 Elevation of levels of liver transaminase levels: Secondary | ICD-10-CM | POA: Insufficient documentation

## 2021-09-14 DIAGNOSIS — E781 Pure hyperglyceridemia: Secondary | ICD-10-CM | POA: Insufficient documentation

## 2021-09-16 NOTE — Progress Notes (Signed)
? ? ? ?Chief Complaint:  ? ?OBESITY ?Dana Liu (MR# [id number]) is a 56 y.o. female who presents for evaluation and treatment of obesity and related comorbidities. Current BMI is Body mass index is 43.27 kg/m?Dana Liu has been struggling with her weight for many years and has been unsuccessful in either losing weight, maintaining weight loss, or reaching her healthy weight goal. ? ?[name] states that she likes to cook but notes time as an obstacles. She craves salt/sweets. 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  ? ?[name] is currently in the action stage of change and ready to dedicate time achieving and maintaining a healthier weight. [name] is interested in becoming our patient and working on intensive lifestyle modifications including (but not limited to) diet and exercise for weight loss. ? ?[name]'s habits were reviewed today and are as follows: her desired weight loss is 110 pounds, she has been heavy most of her life, she started gaining weight after having children pounds, her heaviest weight ever was 275 pounds, she has significant food cravings issues, she skips meals frequently, she is trying to follow a vegetarian/vegan diet, she is frequently drinking liquids with calories, she frequently makes poor food choices, she frequently eats larger portions than normal, and she struggles with emotional eating. ? ?Depression Screen ?Dana Liu's Food and Mood (modified PHQ-9) score was 19. ? ? ?  09/12/2021  ?  8:22 AM  ?Depression screen PHQ 2/9  ?Decreased Interest 3  ?Down, Depressed, Hopeless 2  ?PHQ - 2 Score 5  ?Altered sleeping 3  ?Tired, decreased energy 3  ?Change in appetite 2  ?Feeling bad or failure about yourself  3  ?Trouble concentrating 2  ?Moving slowly or fidgety/restless 1  ?Suicidal thoughts 0  ?PHQ-9 Score 19  ? ?Subjective:  ? ?1. Other fatigue ?[name] admits to daytime somnolence and admits to waking up still tired. Patient has a history of symptoms of daytime fatigue, morning  fatigue, and morning headache. Dana Liu generally gets  5-7  hours of sleep per night, and states that she has nightime awakenings and generally restful sleep. Snoring is present. Apneic episodes is present. Epworth Sleepiness Score is 9.  Dana Liu will continue activities.  ? ?2. SOB (shortness of breath) on exertion ?[name] notes increasing shortness of breath with exercising and seems to be worsening over time with weight gain. She notes getting out of breath sooner with activity than she used to. This has not gotten worse recently. [name] denies shortness of breath at rest or orthopnea.  ? ?3. Other specified hypothyroidism ?Dana Liu is currently taking levothyroxine.  ? ?4. Essential hypertension ?Dana Liu is taking losartan and metoprolol currently. Her blood pressure is controlled today  117/72. ? ?5. Obstructive sleep apnea on CPAP ?Dana Liu uses a CPAP at night.  ? ?6. Irritable bowel syndrome with diarrhea ?Dana Liu will have no fried foods or roughage due to having diarrhea.  ? ?7. Vitamin D deficiency ?Dana Liu is currently taking multivitamins, calcium and Vitamin D.  ? ?8. Fatty liver ?Dana Liu had an ultrasound of the abdomen 05/05/2021. ? ?9. Elevated glucose ?Dana Liu is not on medications currently.  ? ?Assessment/Plan:  ? ?1. Other fatigue ?Dana Liu does feel that her weight is causing her energy to be lower than it should be. Fatigue may be related to obesity, depression or many other causes. Labs will be ordered, and in the meanwhile, Tanga will focus on self care including making healthy food choices, increasing physical activity and focusing on stress reduction. [name] will  increase exercise and activities over time.  ? ?- EKG 12-Lead ?- TSH+T4F+T3Free ? ?2. SOB (shortness of breath) on exertion ?Dana Liu does feel that she gets out of breath more easily that she used to when she exercises. Dana Liu's shortness of breath appears to be obesity related and exercise induced. She has agreed to work on weight loss and gradually increase  exercise to treat her exercise induced shortness of breath. Will continue to monitor closely.  ? ?- TSH+T4F+T3Free ? ?3. Other specified hypothyroidism ?We will check thyroid panel today. Orders and follow up as documented in patient record. ? ?Counseling ?Good thyroid control is important for overall health. Supratherapeutic thyroid levels are dangerous and will not improve weight loss results. ?Counseling: The correct way to take levothyroxine is fasting, with water, separated by at least 30 minutes from breakfast, and separated by more than 4 hours from calcium, iron, multivitamins, acid reflux medications (PPIs).   ? ?- TSH+T4F+T3Free ? ?4. Essential hypertension ?Dana Liu will continue losartan and metoprolol. She is working on healthy weight loss and exercise to improve blood pressure control. We will watch for signs of hypotension as she continues her lifestyle modifications. ? ?5. Obstructive sleep apnea on CPAP ?Intensive lifestyle modifications are the first line treatment for this issue. [name] will continue using her CPAP at night. We discussed several lifestyle modifications today and she will continue to work on diet, exercise and weight loss efforts. We will continue to monitor. Orders and follow up as documented in patient record.   ? ?6. Irritable bowel syndrome with diarrhea ?Dana Liu will follow up with gastroenterologist. She will avoid her triggers. We will check CMP today.  ? ?- Comprehensive metabolic panel ? ?7. Vitamin D deficiency ?Low Vitamin D level contributes to fatigue and are associated with obesity, breast, and colon cancer. We will check Vitamin D level today and Dana Liu will follow-up for routine testing of Vitamin D, at least 2-3 times per year to avoid over-replacement. ? ?- VITAMIN D 25 Hydroxy (Vit-D Deficiency, Fractures) ? ?8. Fatty liver ?We discussed the likely diagnosis of non-alcoholic fatty liver disease today and how this condition is obesity related. Dana Liu was educated the  importance of weight loss. Dana Liu agreed to continue with her weight loss efforts with healthier diet and exercise as an essential part of her treatment plan. Larrisa's gastroenterologist will follow over time.  ? ?9. Elevated glucose ?We will check A1C and insulin today.  ? ?- Lipid Panel With LDL/HDL Ratio ?- Comprehensive metabolic panel ?- Insulin, random ?- Hemoglobin A1c ? ?10. Depression screening ?Cumi had a positive depression screening. Depression is commonly associated with obesity and often results in emotional eating behaviors. We will monitor this closely and work on CBT to help improve the non-hunger eating patterns. Referral to Psychology may be required if no improvement is seen as she continues in our clinic.  ? ?11. Class 3 severe obesity due to excess calories with serious comorbidity and body mass index (BMI) of 40.0 to 44.9 in adult [facility]) ?Atasha is currently in the action stage of change and her goal is to continue with weight loss efforts. I recommend Daneille begin the structured treatment plan as follows: ? ?She has agreed to the Category 4 Plan. ? ?Exercise goals:  Shacara has left knee pain and needs a left knee replacement.    ? ?Behavioral modification strategies: increasing lean protein intake, decreasing simple carbohydrates, increasing vegetables, increasing water intake, decreasing eating out, no skipping meals, meal planning and cooking strategies, keeping  healthy foods in the home, and planning for success. ? ?She was informed of the importance of frequent follow-up visits to maximize her success with intensive lifestyle modifications for her multiple health conditions. She was informed we would discuss her lab results at her next visit unless there is a critical issue that needs to be addressed sooner. Lavonn agreed to keep her next visit at the agreed upon time to discuss these results. ? ?Objective:  ? ?Blood pressure 117/72, pulse 70, temperature 98.3 ?F (36.8 ?C), height '5\' 5"'$  (1.651  m), weight 260 lb (117.9 kg), last menstrual period 11/28/2020, SpO2 97 %. Body mass index is 43.27 kg/m?. ? ?EKG: Normal sinus rhythm, rate unable to obtain. ? ?Indirect Calorimeter completed today show

## 2021-09-18 ENCOUNTER — Encounter (INDEPENDENT_AMBULATORY_CARE_PROVIDER_SITE_OTHER): Payer: Self-pay | Admitting: Bariatrics

## 2021-09-24 NOTE — Progress Notes (Signed)
?Cardiology Office Note:   ? ?Date:  09/26/2021  ? ?ID:  Dana Liu, DOB 1965/09/10, MRN [id number] ? ?PCP:  Lennie Odor, PA ? ?Pronounced: Bur' ska vi chious ?  ?[facility] Providers ?Cardiologist:  Werner Lean, MD    ? ?Referring MD: Lennie Odor, PA  ? ?CC: new palpitations ?Consulted for the evaluation of new palpitations at the [facility], [place], [place] ? ? ?History of Present Illness:   ? ?Dana Liu is a 56 y.o. female with a hx of OSA, HTN, anxiety who presents for evaluation of palpitations 09/26/21. ? ?Patient notes that she is feeling new, sudden onset palpitations. ?Notes that in February 2023 had Covid-19 ?Has had panic attacks, but this feels different.   ? ?Occurs as least once a day.  Has tried to minimize caffeine intake (2 cups of coffee). ? ?Has had these palpitations in 2023; this feels like she had when she had high BP diagnosis. ? ?Has had no chest pain, chest pressure, chest tightness, chest stinging.  Discomfort occurs with waking from her alarm or when she tried to relax, and improves with no interventions.  Has come off effexor due to abdominal discomfort  (this has improved) . ? ? ?Patient exertion notable for water walking in the poor or working at school and feels no symptoms.  No shortness of breath, DOE .  No PND or orthopnea.  No weight gain, leg swelling , or abdominal swelling.   ? ?No syncope or near syncope. ? ? ?Past Medical History:  ?Diagnosis Date  ? Anxiety   ? Arthritis   ? left knee  ? Back pain   ? Depression   ? Fatty liver   ? Heart murmur   ? as teenager has resolved  ? HPV in female   ? Hypertension   ? postpartum  ? Hypothyroidism   ? IBS (irritable bowel syndrome)   ? Joint pain   ? Morbid obesity ([place])   ? Osteoarthritis   ? Palpitations   ? Pneumonia   ? PONV (postoperative nausea and vomiting)   ? Sleep apnea   ? Vitamin D deficiency   ? ? ?Past Surgical History:  ?Procedure Laterality Date  ? APPENDECTOMY    ? 1996  ? COLPOSCOPY   04/08/2006  ? COLPOSCOPY    ? IUD REMOVAL N/A 05/11/2016  ? Procedure: INTRAUTERINE DEVICE (IUD) REMOVAL;  Surgeon: Crawford Givens, MD;  Location: [facility];  Service: Gynecology;  Laterality: N/A;  ? KNEE ARTHROSCOPY W/ MENISCECTOMY    ? LAPAROSCOPIC OVARIAN CYSTECTOMY Left 05/11/2016  ? Procedure: LAPAROSCOPIC OVARIAN CYSTECTOMY ,DRAINAGE LEFT OVARIAN CYST;  Surgeon: Crawford Givens, MD;  Location: [facility];  Service: Gynecology;  Laterality: Left;  ? WISDOM TOOTH EXTRACTION    ? ? ?Current Medications: ?Current Meds  ?Medication Sig  ? gabapentin (NEURONTIN) 300 MG capsule Take 300 mg by mouth daily as needed.  ? levothyroxine (SYNTHROID, LEVOTHROID) 50 MCG tablet Take 50 mcg by mouth daily before breakfast.   ? losartan (COZAAR) 100 MG tablet Take 100 mg by mouth daily.  ? metoprolol succinate (TOPROL-XL) 50 MG 24 hr tablet Take 50 mg by mouth daily.  ? Multiple Vitamin (MULTIVITAMIN) capsule Take 1 capsule by mouth daily.  ? naproxen sodium (ANAPROX) 550 MG tablet Take 550 mg by mouth daily as needed.  ? Probiotic Product (PROBIOTIC PO) Take by mouth.  ? Turmeric (QC TUMERIC COMPLEX PO) Take by mouth.  ? [DISCONTINUED] Calcium 600-200 MG-UNIT tablet Take 1  tablet by mouth daily.  ?  ? ?Allergies:   Patient has no known allergies.  ? ?Social History  ? ?Socioeconomic History  ? Marital status: Divorced  ?  Spouse name: Not on file  ? Number of children: Not on file  ? Years of education: Not on file  ? Highest education level: Not on file  ?Occupational History  ? Not on file  ?Tobacco Use  ? Smoking status: Former  ?  Packs/day: 0.30  ?  Years: 15.00  ?  Pack years: 4.50  ?  Types: Cigarettes  ?  Quit date: 05/16/1998  ?  Years since quitting: 23.3  ? Smokeless tobacco: Never  ?Vaping Use  ? Vaping Use: Never used  ?Substance and Sexual Activity  ? Alcohol use: Yes  ?  Alcohol/week: 1.0 standard drink  ?  Types: 1 Standard drinks or equivalent per week  ?  Comment: occasional glass of wine  ? Drug use: No  ?  Comment:  Marijuana in the 1980's  ? Sexual activity: Not Currently  ?Other Topics Concern  ? Not on file  ?Social History Narrative  ? Not on file  ? ?Social Determinants of Health  ? ?Financial Resource Strain: Not on file  ?Food Insecurity: Not on file  ?Transportation Needs: Not on file  ?Physical Activity: Not on file  ?Stress: Not on file  ?Social Connections: Not on file  ?  ?Social: former Higher education careers adviser, now Scientist, [employer]  ? ?Family History: ?The patient's family history includes Alcohol abuse in her maternal grandmother; Anxiety disorder in her father and mother; Depression in her father; Diabetes in her paternal grandmother; Heart attack in her paternal aunt; Heart disease in her father and paternal grandfather; Hypertension in her father and paternal grandmother; Lung cancer in her paternal uncle; Macular degeneration in her maternal grandmother and mother; Obesity in her father and mother; Sleep apnea in her father, maternal uncle, and mother; Thyroid disease in her father. There is no history of Breast cancer, Colon cancer, Ovarian cancer, Endometrial cancer, Prostate cancer, or Pancreatic cancer. ?Father ha ? ? ?ROS:   ?Please see the history of present illness.    ? All other systems reviewed and are negative. ? ?EKGs/Labs/Other Studies Reviewed:   ? ?The following studies were reviewed today: ? ?EKG:  EKG is  ordered today.  The ekg ordered today demonstrates  ?09/26/21: Sinus bradycardia LAFB ? ?Recent Labs: ?09/12/2021: ALT 56; BUN 12; Creatinine, Ser 0.63; Potassium 4.5; Sodium 140; TSH 2.960  ?Recent Lipid Panel ?   ?Component Value Date/Time  ? CHOL 179 09/12/2021 0948  ? TRIG 159 (H) 09/12/2021 0948  ? HDL 49 09/12/2021 0948  ? LDLCALC 102 (H) 09/12/2021 0948  ? ? ?Physical Exam:   ? ?VS:  BP 112/60   Pulse (!) 52   Ht '5\' 5"'$  (1.651 m)   Wt 256 lb (116.1 kg)   SpO2 97%   BMI 42.60 kg/m?    ? ?Wt Readings from Last 3 Encounters:  ?09/26/21 256 lb (116.1 kg)  ?09/12/21 260 lb  (117.9 kg)  ?11/30/20 274 lb (124.3 kg)  ?  ? ?Gen: no distress, morbid obesity   ?Neck: No JVD ?Ears:  Pilar Plate Sign ?Cardiac: No Rubs or Gallops, no Murmur, RRR +2 radial pulses ?Respiratory: Clear to auscultation bilaterally, normal effort, normal  respiratory rate ?GI: Soft, nontender, non-distended  ?MS: No  edema;  moves all extremities ?Integument: Skin feels warm ?Neuro:  At time of  evaluation, alert and oriented to person/place/time/situation  ?Psych: Normal affect, patient feels  ? ? ?ASSESSMENT:   ? ?1. Palpitations   ?2. Hypothyroidism, unspecified type   ?3. Essential hypertension   ?4. Mixed hyperlipidemia   ?5. Morbid obesity ([place])   ? ?PLAN:   ? ?Palpitations; possible SVT ?Hypothyroidism  ?- controlled on 50 mcg with TSH 2.960 ?- continue metoprolol 50 mg PO daily ?- one week non live ziopatch ?- If need suppression, may need exercise NM Stress given limited additional rate control ?- this may be related to menapause ? ?HTN ?Controled on losartan and metoprolol ? ?HLD ?Morbid obesity ?Family history of early CAD ?- LDL 102 ?- TGS 159 ?- we discusses simple sugar intake and cholesterol ?- defer CAC; we will discuss at 6 month f/u ? ?   ? ? ? ?Medication Adjustments/Labs and Tests Ordered: ?Current medicines are reviewed at length with the patient today.  Concerns regarding medicines are outlined above.  ?Orders Placed This Encounter  ?Procedures  ? LONG TERM MONITOR (3-14 DAYS)  ? EKG 12-Lead  ? ?No orders of the defined types were placed in this encounter. ? ? ?Patient Instructions  ?Medication Instructions:  ?Your physician recommends that you continue on your current medications as directed. Please refer to the Current Medication list given to you today. ? ?*If you need a refill on your cardiac medications before your next appointment, please call your pharmacy* ? ? ?Lab Work: ?NONE ?If you have labs (blood work) drawn today and your tests are completely normal, you will receive your results only  by: ?MyChart Message (if you have MyChart) OR ?A paper copy in the mail ?If you have any lab test that is abnormal or we need to change your treatment, we will call you to review the results. ? ? ?Testing/Proc

## 2021-09-26 ENCOUNTER — Encounter: Payer: Self-pay | Admitting: Internal Medicine

## 2021-09-26 ENCOUNTER — Ambulatory Visit (INDEPENDENT_AMBULATORY_CARE_PROVIDER_SITE_OTHER): Payer: BC Managed Care – PPO

## 2021-09-26 ENCOUNTER — Ambulatory Visit: Payer: BC Managed Care – PPO | Admitting: Internal Medicine

## 2021-09-26 VITALS — BP 112/60 | HR 52 | Ht 65.0 in | Wt 256.0 lb

## 2021-09-26 DIAGNOSIS — E782 Mixed hyperlipidemia: Secondary | ICD-10-CM

## 2021-09-26 DIAGNOSIS — I10 Essential (primary) hypertension: Secondary | ICD-10-CM | POA: Diagnosis not present

## 2021-09-26 DIAGNOSIS — E039 Hypothyroidism, unspecified: Secondary | ICD-10-CM | POA: Diagnosis not present

## 2021-09-26 DIAGNOSIS — R002 Palpitations: Secondary | ICD-10-CM | POA: Diagnosis not present

## 2021-09-26 NOTE — Progress Notes (Unsigned)
Enrolled for [employer] to mail a ZIO XT long term holter monitor to the patients address on file.  

## 2021-09-26 NOTE — Patient Instructions (Signed)
Medication Instructions:  ?Your physician recommends that you continue on your current medications as directed. Please refer to the Current Medication list given to you today. ? ?*If you need a refill on your cardiac medications before your next appointment, please call your pharmacy* ? ? ?Lab Work: ?NONE ?If you have labs (blood work) drawn today and your tests are completely normal, you will receive your results only by: ?MyChart Message (if you have MyChart) OR ?A paper copy in the mail ?If you have any lab test that is abnormal or we need to change your treatment, we will call you to review the results. ? ? ?Testing/Procedures: ?Your physician has requested that you wear a 7 day heart monitor.  ? ? ?Follow-Up: ?At [facility], you and your health needs are our priority.  As part of our continuing mission to provide you with exceptional heart care, we have created designated Provider Care Teams.  These Care Teams include your primary Cardiologist (physician) and Advanced Practice Providers (APPs -  Physician Assistants and Nurse Practitioners) who all work together to provide you with the care you need, when you need it. ? ?Your next appointment:   ?6 month(s) ? ?The format for your next appointment:   ?In Person ? ?Provider:   ?Werner Lean, MD   ? ? ?Other Instructions ?ZIO XT- Long Term Monitor Instructions ? ?Your physician has requested you wear a ZIO patch monitor for 14 days.  ?This is a single patch monitor. [employer] supplies one patch monitor per enrollment. Additional ?stickers are not available. Please do not apply patch if you will be having a Nuclear Stress Test,  ?Echocardiogram, Cardiac CT, MRI, or Chest Xray during the period you would be wearing the  ?monitor. The patch cannot be worn during these tests. You cannot remove and re-apply the  ?ZIO XT patch monitor.  ?Your ZIO patch monitor will be mailed 3 day USPS to your address on file. It may take 3-5 days  ?to receive your monitor  after you have been enrolled.  ?Once you have received your monitor, please review the enclosed instructions. Your monitor  ?has already been registered assigning a specific monitor serial # to you. ? ?Billing and Patient Assistance Program Information ? ?We have supplied [employer] with any of your insurance information on file for billing purposes. ?[employer] offers a sliding scale Patient Assistance Program for patients that do not have  ?insurance, or whose insurance does not completely cover the cost of the ZIO monitor.  ?You must apply for the Patient Assistance Program to qualify for this discounted rate.  ?To apply, please call [employer] at [phone], select option 4, select option 2, ask to apply for  ?Patient Assistance Program. Theodore Demark will ask your household income, and how many people  ?are in your household. They will quote your out-of-pocket cost based on that information.  ?[employer] will also be able to set up a 15-month interest-free payment plan if needed. ? ?Applying the monitor ?  ?Shave hair from upper left chest.  ?Hold abrader disc by orange tab. Rub abrader in 40 strokes over the upper left chest as  ?indicated in your monitor instructions.  ?Clean area with 4 enclosed alcohol pads. Let dry.  ?Apply patch as indicated in monitor instructions. Patch will be placed under collarbone on left  ?side of chest with arrow pointing upward.  ?Rub patch adhesive wings for 2 minutes. Remove white label marked "1". Remove the white  ?label marked "2". Rub patch adhesive  wings for 2 additional minutes.  ?While looking in a mirror, press and release button in center of patch. A small green light will  ?flash 3-4 times. This will be your only indicator that the monitor has been turned on.  ?Do not shower for the first 24 hours. You may shower after the first 24 hours.  ?Press the button if you feel a symptom. You will hear a small click. Record Date, Time and  ?Symptom in the Patient Logbook.  ?When you are  ready to remove the patch, follow instructions on the last 2 pages of Patient  ?Logbook. Stick patch monitor onto the last page of Patient Logbook.  ?Place Patient Logbook in the blue and white box. Use locking tab on box and tape box closed  ?securely. The blue and white box has prepaid postage on it. Please place it in the mailbox as  ?soon as possible. Your physician should have your test results approximately 7 days after the  ?monitor has been mailed back to [facility].  ?Call [facility] at [phone] if you have questions regarding  ?your ZIO XT patch monitor. Call them immediately if you see an orange light blinking on your  ?monitor.  ?If your monitor falls off in less than 4 days, contact our Monitor department at [phone].  ?If your monitor becomes loose or falls off after 4 days call [employer] at [phone] for  ?suggestions on securing your monitor  ? ?Important Information About Sugar ? ? ? ? ?  ?

## 2021-09-28 ENCOUNTER — Encounter (INDEPENDENT_AMBULATORY_CARE_PROVIDER_SITE_OTHER): Payer: Self-pay | Admitting: Family Medicine

## 2021-09-28 ENCOUNTER — Ambulatory Visit (INDEPENDENT_AMBULATORY_CARE_PROVIDER_SITE_OTHER): Payer: BC Managed Care – PPO | Admitting: Family Medicine

## 2021-09-28 VITALS — BP 108/71 | HR 44 | Temp 97.7°F | Ht 65.0 in | Wt 254.0 lb

## 2021-09-28 DIAGNOSIS — E038 Other specified hypothyroidism: Secondary | ICD-10-CM | POA: Diagnosis not present

## 2021-09-28 DIAGNOSIS — E669 Obesity, unspecified: Secondary | ICD-10-CM

## 2021-09-28 DIAGNOSIS — Z6841 Body Mass Index (BMI) 40.0 and over, adult: Secondary | ICD-10-CM

## 2021-09-28 DIAGNOSIS — E7849 Other hyperlipidemia: Secondary | ICD-10-CM

## 2021-09-28 DIAGNOSIS — I10 Essential (primary) hypertension: Secondary | ICD-10-CM | POA: Diagnosis not present

## 2021-09-28 DIAGNOSIS — E559 Vitamin D deficiency, unspecified: Secondary | ICD-10-CM | POA: Diagnosis not present

## 2021-09-28 DIAGNOSIS — E8881 Metabolic syndrome: Secondary | ICD-10-CM

## 2021-09-28 DIAGNOSIS — E7841 Elevated Lipoprotein(a): Secondary | ICD-10-CM

## 2021-09-28 DIAGNOSIS — K760 Fatty (change of) liver, not elsewhere classified: Secondary | ICD-10-CM

## 2021-09-28 DIAGNOSIS — Z9189 Other specified personal risk factors, not elsewhere classified: Secondary | ICD-10-CM

## 2021-10-05 ENCOUNTER — Encounter: Payer: Self-pay | Admitting: Internal Medicine

## 2021-10-07 NOTE — Progress Notes (Unsigned)
Chief Complaint:   OBESITY Dana Liu is here to discuss her progress with her obesity treatment plan along with follow-up of her obesity related diagnoses. Dana Liu is on the Category 4 Plan and states she is following her eating plan approximately 100% of the time. Dana Liu states she is water walking 30 to 45 minutes 2 to 3 times per week.  Today's visit was #: 2 Starting weight: 260 lbs Starting date: 09/12/2021 Today's weight: 254 lbs Today's date: 09/28/2021 Total lbs lost to date: 6 Total lbs lost since last in-office visit: 6  Interim History: Dana Liu is here today for her first follow-up office visit since starting the program with [place]. All blood work/ lab tests that were recently ordered by myself or an outside provider were reviewed with patient today per their request.   Extended time was spent counseling her on all new disease processes that were discovered or preexisting ones that are affected by BMI. She understands that many of these abnormalities will need to monitored regularly along with the current treatment plan of prudent dietary changes, in which we are making each and every office visit, to improve these health parameters.  This is Dana Liu's 1st office visit with me, as she was seen by Dr. Owens Shark initially. She has a history of IBS with diarrhea. Dana Liu is tolerating her meal plan, which initially caused constipation.  Subjective:   1. Essential hypertension Review: taking medications Cozaar and Toprol XL as instructed. She is asymptomatic and denies concerns today.  BP Readings from Last 3 Encounters:  09/28/21 108/71  09/26/21 112/60  09/12/21 117/72   2. Other specified hypothyroidism Dana Liu has a diagnosis of hypothyroidism and is taking Synthroid as prescribed. She is asymptomatic and denies concerns.  3. Vitamin D deficiency Dana Liu has been taking calcium and a vitamin D supplement per her PCP. Her calcium was elevated and she recently saw her doctor at her  PCP's office about it.  4. NAFLD (nonalcoholic fatty liver disease) Dana Liu was newly diagnosed with NAFLD at the end of 2022. Her ALT is 56. She sees Dr. Collene Mares and other providers at [facility] for this.  5. Insulin resistance Dana Liu fasting insulin is 21.9. She denies a prior history of insulin resistance or prediabetes. Dana Liu has not had medication prior.  6. Other hyperlipidemia, elevated LDL with elevated triglycerides Dana Liu triglycerides are elevated and her LDL is slightly elevated. She is not on medication.  7. At risk for diabetes mellitus Dana Liu is at risk for diabetes due to the diagnosis of insulin resistance.  Assessment/Plan:  No orders of the defined types were placed in this encounter.   There are no discontinued medications.   No orders of the defined types were placed in this encounter.    1. Essential hypertension Dana Liu blood pressure is at goal. Her serum creatinine is within normal limits.  2. Other specified hypothyroidism Dana Liu thyroid labs are within normal limits. Medicine management is per her PCP.  3. Vitamin D deficiency We will hold off on a vitamin D supplement until she gets an evaluation and discusses her elevated calcium treatment plan with her PCP.  4. NAFLD (nonalcoholic fatty liver disease) Dana Liu will follow up as directed.  5. Insulin resistance Handouts were given and counseling was done. Dana Liu agrees to reduce simple carbs, increase protein, and continue with weight loss. We will recheck in 3 months or so.  6. Other hyperlipidemia, elevated LDL with elevated triglycerides Dana Liu agrees to follow her prudent nutrition plan. She  agrees to reduce saturated and trans fats by continuing with her prudent nutritional plan and eventually exercising.  7. At risk for diabetes mellitus - Dana Liu was given diabetes prevention education and counseling today of more than 24 minutes.  - Counseled patient on pathophysiology of disease and meaning/  implication of lab results.  - Reviewed how certain foods can either stimulate or inhibit insulin release, and subsequently affect hunger pathways  - Importance of following a healthy meal plan with limiting amounts of simple carbohydrates discussed with patient - Effects of regular aerobic exercise on blood sugar regulation reviewed and encouraged an eventual goal of 30 min 5d/week or more as a minimum.  - Briefly discussed treatment options, which always include dietary and lifestyle modification as first line.   - Handouts provided at patient's desire and/or told to go online to the American Diabetes Association website for further information.   8. Obesity, Current BMI 42.3 Dana Liu agrees to drink half her body weight in water per day.  Dana Liu is currently in the action stage of change. As such, her goal is to continue with weight loss efforts. She has agreed to the Category 4 Plan with breakfast options.   Exercise goals:  As is.  Behavioral modification strategies: increasing lean protein intake, decreasing simple carbohydrates, and increasing water intake.  Dana Liu has agreed to follow-up with our clinic in 2 weeks. She was informed of the importance of frequent follow-up visits to maximize her success with intensive lifestyle modifications for her multiple health conditions.   Objective:   Blood pressure 108/71, pulse (!) 44, temperature 97.7 F (36.5 C), height '5\' 5"'$  (1.651 m), weight 254 lb (115.2 kg), SpO2 97 %. Body mass index is 42.27 kg/m.  General: Cooperative, alert, well developed, in no acute distress. HEENT: Conjunctivae and lids unremarkable. Cardiovascular: Regular rhythm.  Lungs: Normal work of breathing. Neurologic: No focal deficits.   Lab Results  Component Value Date   CREATININE 0.63 09/12/2021   BUN 12 09/12/2021   NA 140 09/12/2021   K 4.5 09/12/2021   CL 102 09/12/2021   CO2 26 09/12/2021   Lab Results  Component Value Date   ALT 56 (H) 09/12/2021    AST 37 09/12/2021   ALKPHOS 107 09/12/2021   BILITOT 0.4 09/12/2021   Lab Results  Component Value Date   HGBA1C 5.5 09/12/2021   Lab Results  Component Value Date   INSULIN 21.9 09/12/2021   Lab Results  Component Value Date   TSH 2.960 09/12/2021   Lab Results  Component Value Date   CHOL 179 09/12/2021   HDL 49 09/12/2021   LDLCALC 102 (H) 09/12/2021   TRIG 159 (H) 09/12/2021   Lab Results  Component Value Date   VD25OH 33.5 09/12/2021   Lab Results  Component Value Date   WBC 9.9 05/03/2016   HGB 14.8 05/03/2016   HCT 43.5 05/03/2016   MCV 89.7 05/03/2016   PLT 277 05/03/2016   No results found for: IRON, TIBC, FERRITIN  Attestation Statements:   Reviewed by clinician on day of visit: allergies, medications, problem list, medical history, surgical history, family history, social history, and previous encounter notes.  IMarcille Blanco, CMA, am acting as transcriptionist for [employer], DO  I have reviewed the above documentation for accuracy and completeness, and I agree with the above. Marjory Sneddon, D.O.  The [place] was signed into law in 2016 which includes the topic of electronic health records.  This provides immediate access to information in MyChart.  This includes consultation notes, operative notes, office notes, lab results and pathology reports.  If you have any questions about what you read please let us know at your next visit so we can discuss your concerns and take corrective action if need be.  We are right here with you.

## 2021-10-12 ENCOUNTER — Ambulatory Visit (INDEPENDENT_AMBULATORY_CARE_PROVIDER_SITE_OTHER): Payer: BC Managed Care – PPO | Admitting: Bariatrics

## 2021-10-12 ENCOUNTER — Encounter (INDEPENDENT_AMBULATORY_CARE_PROVIDER_SITE_OTHER): Payer: Self-pay | Admitting: Bariatrics

## 2021-10-12 VITALS — BP 127/84 | HR 66 | Temp 98.5°F | Ht 65.0 in | Wt 251.0 lb

## 2021-10-12 DIAGNOSIS — E559 Vitamin D deficiency, unspecified: Secondary | ICD-10-CM

## 2021-10-12 DIAGNOSIS — R632 Polyphagia: Secondary | ICD-10-CM

## 2021-10-12 DIAGNOSIS — Z7985 Long-term (current) use of injectable non-insulin antidiabetic drugs: Secondary | ICD-10-CM

## 2021-10-12 DIAGNOSIS — Z6841 Body Mass Index (BMI) 40.0 and over, adult: Secondary | ICD-10-CM

## 2021-10-12 DIAGNOSIS — E669 Obesity, unspecified: Secondary | ICD-10-CM

## 2021-10-12 DIAGNOSIS — E8881 Metabolic syndrome: Secondary | ICD-10-CM

## 2021-10-12 DIAGNOSIS — E88819 Insulin resistance, unspecified: Secondary | ICD-10-CM

## 2021-10-12 MED ORDER — WEGOVY 0.25 MG/0.5ML ~~LOC~~ SOAJ
0.2500 mg | SUBCUTANEOUS | 0 refills | Status: DC
Start: 2021-10-12 — End: 2021-10-31

## 2021-10-16 NOTE — Progress Notes (Signed)
Chief Complaint:   OBESITY Dana Liu is here to discuss her progress with her obesity treatment plan along with follow-up of her obesity related diagnoses. Dana Liu is on the Category 4 Plan with breakfast options and states she is following her eating plan approximately 90% of the time. Dana Liu states she is water walking for 30-40 minutes 2 times per week.  Today's visit was #: 3 Starting weight: 260 lbs Starting date: 09/12/2021 Today's weight: 251 lbs Today's date: 10/12/2021 Total lbs lost to date: 9 lbs Total lbs lost since last in-office visit: 3 lbs  Interim History: Dana Liu is down 3 lbs since her last visit. She needs a left knee replacement. Her goal weight is  232 lbs.   Subjective:   1. Insulin resistance Dana Liu is not on medications. Her insulin level was 21.9. A1C was 5.5.  2. Hypercalcemia Dana Liu may a PTH in the future and consider referral to endocrinology.   3. Vitamin D insufficiency Dana Liu is not on medications.Her last Vitamin D level was 33.5. She was noted to increase calcium. She will hold off on calcium.   4. Polyphagia Dana Liu notes polyphagia in the afternoon.   Assessment/Plan:   1. Insulin resistance Dana Liu will reduce all sugars and starches. She will continue to work on weight loss, exercise, and decreasing simple carbohydrates to help decrease the risk of diabetes. Dana Liu agreed to follow-up with [place] as directed to closely monitor her progress.  2. Hypercalcemia Cardiovascular risk and specific lipid/LDL goals reviewed.  Dana Liu will do her parathyroid gland test and then consider referral to endocrinology. We discussed several lifestyle modifications today and Dana Liu will continue to work on diet, exercise and weight loss efforts. Orders and follow up as documented in patient record.   Counseling Intensive lifestyle modifications are the first line treatment for this issue. Dietary changes: Increase soluble fiber. Decrease simple carbohydrates. Exercise  changes: Moderate to vigorous-intensity aerobic activity 150 minutes per week if tolerated. Lipid-lowering medications: see documented in medical record.  3. Vitamin D insufficiency Low Vitamin D level contributes to fatigue and are associated with obesity, breast, and colon cancer. Dana Liu will hold calcium. She will follow up with her primary care provider and she will follow-up for routine testing of Vitamin D, at least 2-3 times per year to avoid over-replacement.  4. Polyphagia We will refill Wegovy 0.25 mg for 1 month with no refills. She will need pre-authorization. Intensive lifestyle modifications are the first line treatment for this issue. We discussed several lifestyle modifications today and she will continue to work on diet, exercise and weight loss efforts. Orders and follow up as documented in patient record.  Counseling Polyphagia is excessive hunger. Causes can include: low blood sugars, hypERthyroidism, PMS, lack of sleep, stress, insulin resistance, diabetes, certain medications, and diets that are deficient in protein and fiber.   - Semaglutide-Weight Management (WEGOVY) 0.25 MG/0.5ML SOAJ; Inject 0.25 mg into the skin once a week.  Dispense: 2 mL; Refill: 0  5. Obesity, Current BMI 41.8 Dana Liu is currently in the action stage of change. As such, her goal is to continue with weight loss efforts. She has agreed to the Category 4 Plan with breakfast options.    Dana Liu will continue meal planning. She will continue to follow the plan closely 85-95%. We will review labs CMP, Vitamin D, Lipids, A1C, and thyroid.   Exercise goals:  As is.   Behavioral modification strategies: increasing lean protein intake, decreasing simple carbohydrates, increasing vegetables, increasing water intake, decreasing  eating out, no skipping meals, meal planning and cooking strategies, keeping healthy foods in the home, and planning for success.  Dana Liu has agreed to follow-up with our clinic in 2-3  weeks. She was informed of the importance of frequent follow-up visits to maximize her success with intensive lifestyle modifications for her multiple health conditions.   Objective:   Blood pressure 127/84, pulse 66, temperature 98.5 F (36.9 C), height '5\' 5"'$  (1.651 m), weight 251 lb (113.9 kg), SpO2 96 %. Body mass index is 41.77 kg/m.  General: Cooperative, alert, well developed, in no acute distress. HEENT: Conjunctivae and lids unremarkable. Cardiovascular: Regular rhythm.  Lungs: Normal work of breathing. Neurologic: No focal deficits.   Lab Results  Component Value Date   CREATININE 0.63 09/12/2021   BUN 12 09/12/2021   NA 140 09/12/2021   K 4.5 09/12/2021   CL 102 09/12/2021   CO2 26 09/12/2021   Lab Results  Component Value Date   ALT 56 (H) 09/12/2021   AST 37 09/12/2021   ALKPHOS 107 09/12/2021   BILITOT 0.4 09/12/2021   Lab Results  Component Value Date   HGBA1C 5.5 09/12/2021   Lab Results  Component Value Date   INSULIN 21.9 09/12/2021   Lab Results  Component Value Date   TSH 2.960 09/12/2021   Lab Results  Component Value Date   CHOL 179 09/12/2021   HDL 49 09/12/2021   LDLCALC 102 (H) 09/12/2021   TRIG 159 (H) 09/12/2021   Lab Results  Component Value Date   VD25OH 33.5 09/12/2021   Lab Results  Component Value Date   WBC 9.9 05/03/2016   HGB 14.8 05/03/2016   HCT 43.5 05/03/2016   MCV 89.7 05/03/2016   PLT 277 05/03/2016   No results found for: IRON, TIBC, FERRITIN  Attestation Statements:   Reviewed by clinician on day of visit: allergies, medications, problem list, medical history, surgical history, family history, social history, and previous encounter notes.  I, Lizbeth Bark, RMA, am acting as Location manager for [employer], DO.  I have reviewed the above documentation for accuracy and completeness, and I agree with the above. Jearld Lesch, DO

## 2021-10-22 ENCOUNTER — Encounter (INDEPENDENT_AMBULATORY_CARE_PROVIDER_SITE_OTHER): Payer: Self-pay | Admitting: Bariatrics

## 2021-10-22 DIAGNOSIS — J309 Allergic rhinitis, unspecified: Secondary | ICD-10-CM | POA: Insufficient documentation

## 2021-10-22 DIAGNOSIS — Z860100 Personal history of colon polyps, unspecified: Secondary | ICD-10-CM | POA: Insufficient documentation

## 2021-10-22 DIAGNOSIS — F339 Major depressive disorder, recurrent, unspecified: Secondary | ICD-10-CM | POA: Insufficient documentation

## 2021-10-22 DIAGNOSIS — K219 Gastro-esophageal reflux disease without esophagitis: Secondary | ICD-10-CM | POA: Insufficient documentation

## 2021-10-31 ENCOUNTER — Other Ambulatory Visit (HOSPITAL_COMMUNITY): Payer: Self-pay

## 2021-10-31 ENCOUNTER — Encounter (INDEPENDENT_AMBULATORY_CARE_PROVIDER_SITE_OTHER): Payer: Self-pay | Admitting: Bariatrics

## 2021-10-31 ENCOUNTER — Ambulatory Visit (INDEPENDENT_AMBULATORY_CARE_PROVIDER_SITE_OTHER): Payer: BC Managed Care – PPO | Admitting: Bariatrics

## 2021-10-31 VITALS — BP 118/75 | HR 60 | Temp 98.3°F | Ht 65.0 in | Wt 251.0 lb

## 2021-10-31 DIAGNOSIS — R632 Polyphagia: Secondary | ICD-10-CM

## 2021-10-31 DIAGNOSIS — E669 Obesity, unspecified: Secondary | ICD-10-CM | POA: Diagnosis not present

## 2021-10-31 DIAGNOSIS — E782 Mixed hyperlipidemia: Secondary | ICD-10-CM

## 2021-10-31 DIAGNOSIS — Z6841 Body Mass Index (BMI) 40.0 and over, adult: Secondary | ICD-10-CM

## 2021-10-31 MED ORDER — WEGOVY 0.25 MG/0.5ML ~~LOC~~ SOAJ
0.2500 mg | SUBCUTANEOUS | 0 refills | Status: DC
  Filled 2021-10-31: qty 2, 28d supply, fill #0

## 2021-11-01 ENCOUNTER — Telehealth (INDEPENDENT_AMBULATORY_CARE_PROVIDER_SITE_OTHER): Payer: Self-pay | Admitting: Bariatrics

## 2021-11-01 ENCOUNTER — Encounter (INDEPENDENT_AMBULATORY_CARE_PROVIDER_SITE_OTHER): Payer: Self-pay

## 2021-11-01 NOTE — Progress Notes (Unsigned)
Chief Complaint:   OBESITY Dana Liu is here to discuss her progress with her obesity treatment plan along with follow-up of her obesity related diagnoses. Dana Liu is on the Category 4 Plan with breakfast options and states she is following her eating plan approximately 95% of the time. Dana Liu states she is is doing water aerobics for 30-45 minutes 3-4 times per week.  Today's visit was #: 4 Starting weight: 260 lbs Starting date: 09/12/2021 Today's weight: 248 lbs Today's date: 10/31/2021 Total lbs lost to date: 12 Total lbs lost since last in-office visit: 3  Interim History: Dana Liu is down an additional 3 pounds, and she is doing well overall.    Subjective:   1. Polyphagia Dana Liu is taking Wegovy 0.25 mg.  2. Mixed hyperlipidemia Dana Liu is not on medications currently.  Assessment/Plan:   1. Polyphagia Dana Liu will continue Wegovy 0.25 mg once weekly, and we will refill for 1 month.  - Semaglutide-Weight Management (WEGOVY) 0.25 MG/0.5ML SOAJ; Inject 0.25 mg into the skin once a week.  Dispense: 2 mL; Refill: 0  2. Mixed hyperlipidemia Dana Liu will eliminate trans fats, and minimize saturated fats.  3. Obesity, Current BMI 41.4 Dana Liu is currently in the action stage of change. As such, her goal is to continue with weight loss efforts. She has agreed to the Category 4 Plan with breakfast options.   Meal planning and intentional eating were discussed.   Exercise goals: As is.   Behavioral modification strategies: increasing lean protein intake, decreasing simple carbohydrates, increasing vegetables, increasing water intake, decreasing eating out, no skipping meals, meal planning and cooking strategies, keeping healthy foods in the home, and planning for success.  Dana Liu has agreed to follow-up with our clinic in 2 to 3 weeks. She was informed of the importance of frequent follow-up visits to maximize her success with intensive lifestyle modifications for her multiple health  conditions.   Objective:   Blood pressure 118/75, pulse 60, temperature 98.3 F (36.8 C), height '5\' 5"'$  (1.651 m), weight 251 lb (113.9 kg), SpO2 94 %. Body mass index is 41.77 kg/m.  General: Cooperative, alert, well developed, in no acute distress. HEENT: Conjunctivae and lids unremarkable. Cardiovascular: Regular rhythm.  Lungs: Normal work of breathing. Neurologic: No focal deficits.   Lab Results  Component Value Date   CREATININE 0.63 09/12/2021   BUN 12 09/12/2021   NA 140 09/12/2021   K 4.5 09/12/2021   CL 102 09/12/2021   CO2 26 09/12/2021   Lab Results  Component Value Date   ALT 56 (H) 09/12/2021   AST 37 09/12/2021   ALKPHOS 107 09/12/2021   BILITOT 0.4 09/12/2021   Lab Results  Component Value Date   HGBA1C 5.5 09/12/2021   Lab Results  Component Value Date   INSULIN 21.9 09/12/2021   Lab Results  Component Value Date   TSH 2.960 09/12/2021   Lab Results  Component Value Date   CHOL 179 09/12/2021   HDL 49 09/12/2021   LDLCALC 102 (H) 09/12/2021   TRIG 159 (H) 09/12/2021   Lab Results  Component Value Date   VD25OH 33.5 09/12/2021   Lab Results  Component Value Date   WBC 9.9 05/03/2016   HGB 14.8 05/03/2016   HCT 43.5 05/03/2016   MCV 89.7 05/03/2016   PLT 277 05/03/2016   No results found for: "IRON", "TIBC", "FERRITIN"  Attestation Statements:   Reviewed by clinician on day of visit: allergies, medications, problem list, medical history, surgical history, family history,  social history, and previous encounter notes.   Dana Liu, am acting as Location manager for [employer], DO.  I have reviewed the above documentation for accuracy and completeness, and I agree with the above. -  ***

## 2021-11-01 NOTE — Telephone Encounter (Signed)
Dr. Owens Shark - Prior authorization approved for [id number]. Effective: 11/01/2021 - 06/03/2022. Patient sent approval message via mychart.

## 2021-11-03 ENCOUNTER — Other Ambulatory Visit (HOSPITAL_COMMUNITY): Payer: Self-pay

## 2021-11-04 ENCOUNTER — Encounter (INDEPENDENT_AMBULATORY_CARE_PROVIDER_SITE_OTHER): Payer: Self-pay | Admitting: Bariatrics

## 2021-11-04 ENCOUNTER — Other Ambulatory Visit (HOSPITAL_COMMUNITY): Payer: Self-pay

## 2021-11-06 ENCOUNTER — Other Ambulatory Visit (HOSPITAL_COMMUNITY): Payer: Self-pay

## 2021-11-27 ENCOUNTER — Ambulatory Visit (INDEPENDENT_AMBULATORY_CARE_PROVIDER_SITE_OTHER): Payer: BC Managed Care – PPO | Admitting: Bariatrics

## 2021-11-27 ENCOUNTER — Other Ambulatory Visit (HOSPITAL_COMMUNITY): Payer: Self-pay

## 2021-11-27 ENCOUNTER — Encounter (INDEPENDENT_AMBULATORY_CARE_PROVIDER_SITE_OTHER): Payer: Self-pay | Admitting: Bariatrics

## 2021-11-27 VITALS — BP 120/83 | HR 57 | Temp 97.8°F | Ht 65.0 in | Wt 242.0 lb

## 2021-11-27 DIAGNOSIS — E559 Vitamin D deficiency, unspecified: Secondary | ICD-10-CM | POA: Diagnosis not present

## 2021-11-27 DIAGNOSIS — E669 Obesity, unspecified: Secondary | ICD-10-CM

## 2021-11-27 DIAGNOSIS — R632 Polyphagia: Secondary | ICD-10-CM | POA: Diagnosis not present

## 2021-11-27 DIAGNOSIS — E038 Other specified hypothyroidism: Secondary | ICD-10-CM | POA: Diagnosis not present

## 2021-11-27 DIAGNOSIS — Z6841 Body Mass Index (BMI) 40.0 and over, adult: Secondary | ICD-10-CM

## 2021-11-27 MED ORDER — SEMAGLUTIDE-WEIGHT MANAGEMENT 0.5 MG/0.5ML ~~LOC~~ SOAJ
0.5000 mg | SUBCUTANEOUS | 0 refills | Status: DC
  Filled 2021-11-27: qty 2, 28d supply, fill #0

## 2021-11-27 MED ORDER — WEGOVY 0.25 MG/0.5ML ~~LOC~~ SOAJ: 0.2500 mg | SUBCUTANEOUS | 0 refills | Status: DC

## 2021-11-28 LAB — COMPREHENSIVE METABOLIC PANEL
ALT: 31 IU/L (ref 0–32)
AST: 18 IU/L (ref 0–40)
Albumin/Globulin Ratio: 1.8 (ref 1.2–2.2)
Albumin: 4.4 g/dL (ref 3.8–4.9)
Alkaline Phosphatase: 111 IU/L (ref 44–121)
BUN/Creatinine Ratio: 25 — ABNORMAL HIGH (ref 9–23)
BUN: 15 mg/dL (ref 6–24)
Bilirubin Total: <0.2 mg/dL (ref 0.0–1.2)
CO2: 23 mmol/L (ref 20–29)
Calcium: 11.4 mg/dL — ABNORMAL HIGH (ref 8.7–10.2)
Chloride: 103 mmol/L (ref 96–106)
Creatinine, Ser: 0.6 mg/dL (ref 0.57–1.00)
Globulin, Total: 2.4 g/dL (ref 1.5–4.5)
Glucose: 91 mg/dL (ref 70–99)
Potassium: 4.5 mmol/L (ref 3.5–5.2)
Sodium: 140 mmol/L (ref 134–144)
Total Protein: 6.8 g/dL (ref 6.0–8.5)
eGFR: 107 mL/min/{1.73_m2} (ref >59)

## 2021-11-28 LAB — VITAMIN D 25 HYDROXY (VIT D DEFICIENCY, FRACTURES): Vit D, 25-Hydroxy: 35.2 ng/mL (ref 30.0–100.0)

## 2021-11-29 NOTE — Progress Notes (Unsigned)
Chief Complaint:   OBESITY Dana Liu is here to discuss her progress with her obesity treatment plan along with follow-up of her obesity related diagnoses. Dana Liu is on the Category 4 Plan with breakfast options and states she is following her eating plan approximately 95% of the time. Dana Liu states she is doing water walking for 30 to 60 minutes 3-4 times per week.  Today's visit was #: 5 Starting weight: 260 lbs Starting date: 09/12/2021 Today's weight: 242 lbs Today's date: 11/28/2021 Total lbs lost to date: 18 Total lbs lost since last in-office visit: 9  Interim History: Dana Liu is down another 9 pounds since her last visit.  She has been to the beach.  Subjective:   1. Polyphagia Dana Liu is currently taking Wegovy, and she notes sulfa bumps.  2. Other specified hypothyroidism Dana Liu is currently taking Synthroid.  3. 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  4. Vitamin D deficiency Dana Liu is currently taking OTC vitamin D.  Assessment/Plan:   1. Polyphagia Dana Liu agreed to increase Wegovy to 0.5 mg once weekly, and we will refill for 1 month.  We will follow-up at her next visit.  - Semaglutide-Weight Management 0.5 MG/0.5ML SOAJ; Inject 0.5 mg into the skin once a week.  Dispense: 2 mL; Refill: 0  2. Other specified hypothyroidism Dana Liu will continue her medications as directed.  3. Hypocalcemia We will check labs today.  Dana Liu had a PHT done last month, and we will follow-up at her next visit.  - Comprehensive metabolic panel  4. Vitamin D deficiency We will check labs today, and Dana Liu will continue taking her vitamin D as directed.  - VITAMIN D 25 Hydroxy (Vit-D Deficiency, Fractures)  5. Obesity, Current BMI 40.3 Dana Liu is currently in the action stage of change. As such, her goal is to continue with weight loss efforts. She has agreed to the Category 4 Plan.   She will continue to follow the plan closely.  Exercise goals: As  is.  Behavioral modification strategies: increasing lean protein intake, decreasing simple carbohydrates, increasing vegetables, increasing water intake, decreasing eating out, no skipping meals, meal planning and cooking strategies, keeping healthy foods in the home, and planning for success.  Dana Liu has agreed to follow-up with our clinic in 2-3 weeks. She was informed of the importance of frequent follow-up visits to maximize her success with intensive lifestyle modifications for her multiple health conditions.   Dana Liu was informed we would discuss her lab results at her next visit unless there is a critical issue that needs to be addressed sooner. Dana Liu agreed to keep her next visit at the agreed upon time to discuss these results.  Objective:   Blood pressure 120/83, pulse (!) 57, temperature 97.8 F (36.6 C), height '5\' 5"'$  (1.651 m), weight 242 lb (109.8 kg), SpO2 96 %. Body mass index is 40.27 kg/m.  General: Cooperative, alert, well developed, in no acute distress. HEENT: Conjunctivae and lids unremarkable. Cardiovascular: Regular rhythm.  Lungs: Normal work of breathing. Neurologic: No focal deficits.   Lab Results  Component Value Date   CREATININE 0.60 11/27/2021   BUN 15 11/27/2021   NA 140 11/27/2021   K 4.5 11/27/2021   CL 103 11/27/2021   CO2 23 11/27/2021   Lab Results  Component Value Date   ALT 31 11/27/2021   AST 18 11/27/2021   ALKPHOS 111 11/27/2021   BILITOT <0.2 11/27/2021   Lab Results  Component Value Date   HGBA1C 5.5 09/12/2021  Lab Results  Component Value Date   INSULIN 21.9 09/12/2021   Lab Results  Component Value Date   TSH 2.960 09/12/2021   Lab Results  Component Value Date   CHOL 179 09/12/2021   HDL 49 09/12/2021   LDLCALC 102 (H) 09/12/2021   TRIG 159 (H) 09/12/2021   Lab Results  Component Value Date   VD25OH 35.2 11/27/2021   VD25OH 33.5 09/12/2021   Lab Results  Component Value Date   WBC 9.9 05/03/2016   HGB 14.8  05/03/2016   HCT 43.5 05/03/2016   MCV 89.7 05/03/2016   PLT 277 05/03/2016   No results found for: "IRON", "TIBC", "FERRITIN"  Attestation Statements:   Reviewed by clinician on day of visit: allergies, medications, problem list, medical history, surgical history, family history, social history, and previous encounter notes.   Dana Liu, am acting as Location manager for [employer], DO.  I have reviewed the above documentation for accuracy and completeness, and I agree with the above. Dana Lesch, DO

## 2021-11-30 ENCOUNTER — Encounter (INDEPENDENT_AMBULATORY_CARE_PROVIDER_SITE_OTHER): Payer: Self-pay

## 2021-11-30 ENCOUNTER — Telehealth (INDEPENDENT_AMBULATORY_CARE_PROVIDER_SITE_OTHER): Payer: Self-pay

## 2021-11-30 NOTE — Telephone Encounter (Signed)
-----   Message from Georgia Lopes, DO sent at 11/30/2021 10:40 AM EDT ----- Regarding: PTH Call patient. Her calcium is higher. I want to refer her to endocrinology for an evaluation. Can I put in the referral ?  ----- Message ----- From: Interface, [facility] Lab Results In Sent: 11/28/2021   5:38 AM EDT To: Georgia Lopes, DO

## 2021-11-30 NOTE — Telephone Encounter (Signed)
Done

## 2021-11-30 NOTE — Telephone Encounter (Signed)
Spoke to patient and notified her of Dr. Roosvelt Harps recommendations. Patient verbalized understanding, and stated she would like a referral put in for endocrinology.

## 2021-12-04 ENCOUNTER — Other Ambulatory Visit (HOSPITAL_COMMUNITY): Payer: Self-pay

## 2021-12-05 ENCOUNTER — Encounter (INDEPENDENT_AMBULATORY_CARE_PROVIDER_SITE_OTHER): Payer: Self-pay | Admitting: Bariatrics

## 2021-12-20 ENCOUNTER — Other Ambulatory Visit (HOSPITAL_COMMUNITY): Payer: Self-pay

## 2021-12-20 ENCOUNTER — Encounter (INDEPENDENT_AMBULATORY_CARE_PROVIDER_SITE_OTHER): Payer: Self-pay

## 2021-12-23 ENCOUNTER — Other Ambulatory Visit (HOSPITAL_COMMUNITY): Payer: Self-pay

## 2021-12-26 ENCOUNTER — Encounter (INDEPENDENT_AMBULATORY_CARE_PROVIDER_SITE_OTHER): Payer: Self-pay | Admitting: Bariatrics

## 2021-12-26 ENCOUNTER — Ambulatory Visit (INDEPENDENT_AMBULATORY_CARE_PROVIDER_SITE_OTHER): Payer: BC Managed Care – PPO | Admitting: Bariatrics

## 2021-12-26 ENCOUNTER — Other Ambulatory Visit (HOSPITAL_COMMUNITY): Payer: Self-pay

## 2021-12-26 VITALS — BP 118/75 | HR 74 | Temp 97.8°F | Ht 65.0 in | Wt 243.0 lb

## 2021-12-26 DIAGNOSIS — E038 Other specified hypothyroidism: Secondary | ICD-10-CM

## 2021-12-26 DIAGNOSIS — E669 Obesity, unspecified: Secondary | ICD-10-CM | POA: Diagnosis not present

## 2021-12-26 DIAGNOSIS — R632 Polyphagia: Secondary | ICD-10-CM

## 2021-12-26 DIAGNOSIS — Z6841 Body Mass Index (BMI) 40.0 and over, adult: Secondary | ICD-10-CM

## 2021-12-26 MED ORDER — SEMAGLUTIDE-WEIGHT MANAGEMENT 0.5 MG/0.5ML ~~LOC~~ SOAJ
0.5000 mg | SUBCUTANEOUS | 0 refills | Status: DC
  Filled 2021-12-26: qty 2, 28d supply, fill #0

## 2021-12-27 NOTE — Progress Notes (Signed)
Chief Complaint:   OBESITY Dana Liu is here to discuss her progress with her obesity treatment plan along with follow-up of her obesity related diagnoses. Dana Liu is on the Category 4 Plan and states she is following her eating plan approximately 75% of the time. Dana Liu states she is doing water walking for 30 to 45 minutes 4-5 times per week.  Today's visit was #: 6 Starting weight: 260 lbs Starting date: 09/12/2021 Today's weight: 243 lbs Today's date: 12/26/21 Total lbs lost to date: 17 Total lbs lost since last in-office visit: +1  Interim History: She is up 1 pound since her last visit. She went "off the rails" for several days.  She is drinking more water.  Subjective:   1. Polyphagia Restarted Wegovy after a 3 to 4-week hiatus.  2. Other specified hypothyroidism Taking Synthroid.  Assessment/Plan:   1. Polyphagia Refill - Semaglutide-Weight Management 0.5 MG/0.5ML SOAJ; Inject 0.5 mg into the skin once a week.  Dispense: 2 mL; Refill: 0  2. Other specified hypothyroidism Continue Synthroid  3. Obesity, Current BMI 40.5 1.  Continue to increase water and protein.  Dana Liu is currently in the action stage of change. As such, her goal is to continue with weight loss efforts. She has agreed to the Category 4 Plan.   Exercise goals: Water exercises.  Behavioral modification strategies: increasing lean protein intake, decreasing simple carbohydrates, increasing vegetables, increasing water intake, decreasing eating out, no skipping meals, meal planning and cooking strategies, keeping healthy foods in the home, and planning for success.  Dana Liu has agreed to follow-up with our clinic in 3 weeks with Dr. Valetta Close. She was informed of the importance of frequent follow-up visits to maximize her success with intensive lifestyle modifications for her multiple health conditions.    Objective:   Blood pressure 118/75, pulse 74, temperature 97.8 F (36.6 C), height '5\' 5"'$  (1.651 m),  weight 243 lb (110.2 kg), SpO2 96 %. Body mass index is 40.44 kg/m.  General: Cooperative, alert, well developed, in no acute distress. HEENT: Conjunctivae and lids unremarkable. Cardiovascular: Regular rhythm.  Lungs: Normal work of breathing. Neurologic: No focal deficits.   Lab Results  Component Value Date   CREATININE 0.60 11/27/2021   BUN 15 11/27/2021   NA 140 11/27/2021   K 4.5 11/27/2021   CL 103 11/27/2021   CO2 23 11/27/2021   Lab Results  Component Value Date   ALT 31 11/27/2021   AST 18 11/27/2021   ALKPHOS 111 11/27/2021   BILITOT <0.2 11/27/2021   Lab Results  Component Value Date   HGBA1C 5.5 09/12/2021   Lab Results  Component Value Date   INSULIN 21.9 09/12/2021   Lab Results  Component Value Date   TSH 2.960 09/12/2021   Lab Results  Component Value Date   CHOL 179 09/12/2021   HDL 49 09/12/2021   LDLCALC 102 (H) 09/12/2021   TRIG 159 (H) 09/12/2021   Lab Results  Component Value Date   VD25OH 35.2 11/27/2021   VD25OH 33.5 09/12/2021   Lab Results  Component Value Date   WBC 9.9 05/03/2016   HGB 14.8 05/03/2016   HCT 43.5 05/03/2016   MCV 89.7 05/03/2016   PLT 277 05/03/2016   No results found for: "IRON", "TIBC", "FERRITIN"   Attestation Statements:   Reviewed by clinician on day of visit: allergies, medications, problem list, medical history, surgical history, family history, social history, and previous encounter notes.  I, Dawn Whitmire, FNP-C, am acting as  transcriptionist for Dr. Jearld Lesch.  I have reviewed the above documentation for accuracy and completeness, and I agree with the above. Jearld Lesch, DO

## 2022-01-01 ENCOUNTER — Encounter (INDEPENDENT_AMBULATORY_CARE_PROVIDER_SITE_OTHER): Payer: Self-pay | Admitting: Bariatrics

## 2022-01-17 ENCOUNTER — Ambulatory Visit (INDEPENDENT_AMBULATORY_CARE_PROVIDER_SITE_OTHER): Payer: BC Managed Care – PPO | Admitting: Family Medicine

## 2022-01-17 ENCOUNTER — Encounter (INDEPENDENT_AMBULATORY_CARE_PROVIDER_SITE_OTHER): Payer: Self-pay | Admitting: Family Medicine

## 2022-01-17 VITALS — BP 109/71 | HR 73 | Temp 98.8°F | Ht 65.0 in | Wt 245.0 lb

## 2022-01-17 DIAGNOSIS — E8881 Metabolic syndrome: Secondary | ICD-10-CM

## 2022-01-17 DIAGNOSIS — E669 Obesity, unspecified: Secondary | ICD-10-CM

## 2022-01-17 DIAGNOSIS — Z6841 Body Mass Index (BMI) 40.0 and over, adult: Secondary | ICD-10-CM

## 2022-01-17 DIAGNOSIS — R632 Polyphagia: Secondary | ICD-10-CM

## 2022-01-17 DIAGNOSIS — K5909 Other constipation: Secondary | ICD-10-CM

## 2022-01-17 DIAGNOSIS — E88819 Insulin resistance, unspecified: Secondary | ICD-10-CM

## 2022-01-17 DIAGNOSIS — E66813 Obesity, class 3: Secondary | ICD-10-CM

## 2022-01-17 MED ORDER — SEMAGLUTIDE-WEIGHT MANAGEMENT 0.5 MG/0.5ML ~~LOC~~ SOAJ
0.5000 mg | SUBCUTANEOUS | 0 refills | Status: DC
  Filled 2022-01-17: qty 2, 28d supply, fill #0

## 2022-01-18 ENCOUNTER — Other Ambulatory Visit (HOSPITAL_COMMUNITY): Payer: Self-pay

## 2022-01-18 DIAGNOSIS — K5909 Other constipation: Secondary | ICD-10-CM | POA: Insufficient documentation

## 2022-01-22 NOTE — Progress Notes (Signed)
Chief Complaint:   OBESITY Dana Liu is here to discuss her progress with her obesity treatment plan along with follow-up of her obesity related diagnoses. Dana Liu is on the Category 4 Plan and states she is following her eating plan approximately 90-95% of the time. Dana Liu states she is water walking 30-40 minutes 3 times per week.  Today's visit was #: 7 Starting weight: 260 lbs Starting date: 09/12/2021 Today's weight: 245 lbs Today's date: 01/17/2022 Total lbs lost to date: 15 lbs Total lbs lost since last in-office visit: +2 lbs  Interim History: denies meal skipping.  Has constipation.  On Wegovy 0.5 mg once weekly.  Does drink premier protein shakes.  Getting in fruits and veggies.  Getting 40-60 oz of water daily.  Unable to get all the meat in.  Using my fitness pal.    Subjective:   1. Other constipation May be due to high protein diet, lack of adequate water.  2. Hypercalcemia Off Vitamin D due to increased calcium levels. Met wit endocrinology.   3. Insulin resistance Fasting insulin 21.9.  Has decreased sugar intake.   Assessment/Plan:   1. Other constipation Begin Miralax daily as needed.  Increase water to 64 oz or more daily.  2. Hypercalcemia Follow up with endocrinology next week.   3. Insulin resistance Continue to work on on a low sugar/low carb diet.  4. Obesity, current BMI 40.8 Refill - Semaglutide-Weight Management 0.5 MG/0.5ML SOAJ; Inject 0.5 mg into the skin once a week.  Dispense: 2 mL; Refill: 0  Dana Liu is currently in the action stage of change. As such, her goal is to continue with weight loss efforts. She has agreed to keeping a food journal and adhering to recommended goals of 1600 calories and 100 protein daily.    Exercise goals:  As is.   Behavioral modification strategies: increasing lean protein intake, increasing vegetables, increasing water intake, no skipping meals, meal planning and cooking strategies, better snacking choices, and  planning for success.  Dana Liu has agreed to follow-up with our clinic in 4 weeks. She was informed of the importance of frequent follow-up visits to maximize her success with intensive lifestyle modifications for her multiple health conditions.   Objective:   Blood pressure 109/71, pulse 73, temperature 98.8 F (37.1 C), height _0  (1.651 m), weight 245 lb (111.1 kg), SpO2 95 %. Body mass index is 40.77 kg/m.  General: Cooperative, alert, well developed, in no acute distress. HEENT: Conjunctivae and lids unremarkable. Cardiovascular: Regular rhythm.  Lungs: Normal work of breathing. Neurologic: No focal deficits.   Lab Results  Component Value Date   CREATININE 0.60 11/27/2021   BUN 15 11/27/2021   NA 140 11/27/2021   K 4.5 11/27/2021   CL 103 11/27/2021   CO2 23 11/27/2021   Lab Results  Component Value Date   ALT 31 11/27/2021   AST 18 11/27/2021   ALKPHOS 111 11/27/2021   BILITOT <0.2 11/27/2021   Lab Results  Component Value Date   HGBA1C 5.5 09/12/2021   Lab Results  Component Value Date   INSULIN 21.9 09/12/2021   Lab Results  Component Value Date   TSH 2.960 09/12/2021   Lab Results  Component Value Date   CHOL 179 09/12/2021   HDL 49 09/12/2021   LDLCALC 102 (H) 09/12/2021   TRIG 159 (H) 09/12/2021   Lab Results  Component Value Date   VD25OH 35.2 11/27/2021   VD25OH 33.5 09/12/2021   Lab Results  Component  Value Date   WBC 9.9 05/03/2016   HGB 14.8 05/03/2016   HCT 43.5 05/03/2016   MCV 89.7 05/03/2016   PLT 277 05/03/2016   No results found for: "IRON", "TIBC", "FERRITIN"  Attestation Statements:   Reviewed by clinician on day of visit: allergies, medications, problem list, medical history, surgical history, family history, social history, and previous encounter notes.  I, Davy Pique, am acting as Location manager for Loyal Gambler, DO.  I have reviewed the above documentation for accuracy and completeness, and I agree with the  above. Dell Ponto, DO

## 2022-02-06 ENCOUNTER — Telehealth: Payer: Self-pay

## 2022-02-06 NOTE — Telephone Encounter (Signed)
   Name: Dana Liu  DOB: 21-Mar-1966  MRN: [id number]  Primary Cardiologist: Werner Lean, MD  Chart reviewed as part of pre-operative protocol coverage.Dana Liu was last seen 09/26/21 for palpitations with subsequent ZIO monitor with no malignant arrhythmias.   She has upcoming follow up 03/21/22 with Dr. Gasper Sells. Preop clearance can be addressed at that time. Appt notes updated.   She does not take anticoagulant or antiplatelet that would need help. She does take Semaglutide which per current anesthesia recommendations should be held 1 week prior to surgery.  Will route to requesting party via Epic fax function and remove from preop pool.   Loel Dubonnet, NP  02/06/2022, 11:21 AM

## 2022-02-06 NOTE — Telephone Encounter (Signed)
..     Pre-operative Risk Assessment    Patient Name: Dana Liu  DOB: [birth year] MRN: [id number]      Request for Surgical Clearance    Procedure:   LEFT TOTAL KNEE ARTHROPLASTY  Date of Surgery:  Clearance 04/03/22                                 Surgeon:  DR Gaynelle Arabian Surgeon's Group or Practice Name:  [facility] Phone number:  [phone] Fax number:  [phone]   Type of Clearance Requested:   - Medical / PATIENT IS NOT ON ANY MEDICATION TO HOLD   Type of Anesthesia:   CHOICE   Additional requests/questions:    Gwenlyn Found   02/06/2022, 9:00 AM

## 2022-02-14 ENCOUNTER — Encounter (INDEPENDENT_AMBULATORY_CARE_PROVIDER_SITE_OTHER): Payer: Self-pay | Admitting: Family Medicine

## 2022-02-14 ENCOUNTER — Ambulatory Visit (INDEPENDENT_AMBULATORY_CARE_PROVIDER_SITE_OTHER): Payer: BC Managed Care – PPO | Admitting: Family Medicine

## 2022-02-14 ENCOUNTER — Other Ambulatory Visit (HOSPITAL_COMMUNITY): Payer: Self-pay

## 2022-02-14 VITALS — BP 125/80 | HR 73 | Temp 98.5°F | Ht 65.0 in | Wt 240.0 lb

## 2022-02-14 DIAGNOSIS — K5909 Other constipation: Secondary | ICD-10-CM

## 2022-02-14 DIAGNOSIS — M1712 Unilateral primary osteoarthritis, left knee: Secondary | ICD-10-CM

## 2022-02-14 DIAGNOSIS — E669 Obesity, unspecified: Secondary | ICD-10-CM

## 2022-02-14 DIAGNOSIS — E88819 Insulin resistance, unspecified: Secondary | ICD-10-CM | POA: Diagnosis not present

## 2022-02-14 DIAGNOSIS — Z6841 Body Mass Index (BMI) 40.0 and over, adult: Secondary | ICD-10-CM

## 2022-02-14 DIAGNOSIS — Z6839 Body mass index (BMI) 39.0-39.9, adult: Secondary | ICD-10-CM

## 2022-02-14 MED ORDER — SEMAGLUTIDE-WEIGHT MANAGEMENT 0.5 MG/0.5ML ~~LOC~~ SOAJ: 0.5000 mg | SUBCUTANEOUS | 0 refills | Status: DC

## 2022-02-21 ENCOUNTER — Other Ambulatory Visit (HOSPITAL_COMMUNITY): Payer: Self-pay

## 2022-02-22 NOTE — Progress Notes (Signed)
Chief Complaint:   OBESITY Dana Liu is here to discuss her progress with her obesity treatment plan along with follow-up of her obesity related diagnoses. Dana Liu is on keeping a food journal and adhering to recommended goals of 1600 calories and 100 protein and states she is following her eating plan approximately 80% of the time. Dana Liu states she is at the gym and water walking 30 minutes 1-2 times per week.  Today's visit was #: 8 Starting weight: 260 lbs Starting date: 09/12/2021 Today's weight: 240 lbs Today's date: 02/14/2022 Total lbs lost to date: 20 lbs Total lbs lost since last in-office visit: 5 lbs  Interim History: Exercise has reduced due to work schedule.  Logging intake and has increased protein intake, with a protein shake daily.  She is on Wegovy 0.5 mg injection.  Enjoys the satiety.  Has some slight nausea.   Subjective:   1. Osteoarthritis of left knee, unspecified osteoarthritis type She has plans to have left total knee replacement 1st then right knee by Dr Maureen Ralphs.   2. Insulin resistance Fasting insulin level 21.9.  She has reduced her intake of sugar and lost 20 lbs.   3. Other constipation Her constipation has been worsened by [facility].  She has increased water intake and uses Miralax as needed.   Assessment/Plan:   1. Osteoarthritis of left knee, unspecified osteoarthritis type She has reduced her BMI to <40 for left total knee replacement that is planned for November 2023.  2. Insulin resistance Look for improvements with weight loss, recheck in January 2024.  3. Other constipation Aim for 2 servings of fruit daily and 3 servings of veggies daily.   4. Obesity, current BMI 39.9 Refill - Semaglutide-Weight Management 0.5 MG/0.5ML SOAJ; Inject 0.5 mg into the skin once a week.  Dispense: 2 mL; Refill: 0  Dana Liu is currently in the action stage of change. As such, her goal is to continue with weight loss efforts. She has agreed to keeping a food  journal and adhering to recommended goals of 1600 calories and 100 protein.   Exercise goals:  Increase water exercise to 3-4 times per week.   Behavioral modification strategies: increasing lean protein intake, increasing vegetables, increasing water intake, decreasing eating out, no skipping meals, meal planning and cooking strategies, keeping healthy foods in the home, better snacking choices, and planning for success.  Dana Liu has agreed to follow-up with our clinic in 4 weeks. She was informed of the importance of frequent follow-up visits to maximize her success with intensive lifestyle modifications for her multiple health conditions.   Objective:   Blood pressure 125/80, pulse 73, temperature 98.5 F (36.9 C), height '5\' 5"'$  (1.651 m), weight 240 lb (108.9 kg), SpO2 95 %. Body mass index is 39.94 kg/m.  General: Cooperative, alert, well developed, in no acute distress. HEENT: Conjunctivae and lids unremarkable. Cardiovascular: Regular rhythm.  Lungs: Normal work of breathing. Neurologic: No focal deficits.   Lab Results  Component Value Date   CREATININE 0.60 11/27/2021   BUN 15 11/27/2021   NA 140 11/27/2021   K 4.5 11/27/2021   CL 103 11/27/2021   CO2 23 11/27/2021   Lab Results  Component Value Date   ALT 31 11/27/2021   AST 18 11/27/2021   ALKPHOS 111 11/27/2021   BILITOT <0.2 11/27/2021   Lab Results  Component Value Date   HGBA1C 5.5 09/12/2021   Lab Results  Component Value Date   INSULIN 21.9 09/12/2021   Lab Results  Component Value Date   TSH 2.960 09/12/2021   Lab Results  Component Value Date   CHOL 179 09/12/2021   HDL 49 09/12/2021   LDLCALC 102 (H) 09/12/2021   TRIG 159 (H) 09/12/2021   Lab Results  Component Value Date   VD25OH 35.2 11/27/2021   VD25OH 33.5 09/12/2021   Lab Results  Component Value Date   WBC 9.9 05/03/2016   HGB 14.8 05/03/2016   HCT 43.5 05/03/2016   MCV 89.7 05/03/2016   PLT 277 05/03/2016   No results found  for: "IRON", "TIBC", "FERRITIN"  Attestation Statements:   Reviewed by clinician on day of visit: allergies, medications, problem list, medical history, surgical history, family history, social history, and previous encounter notes.  I, Davy Pique, am acting as Location manager for Loyal Gambler, DO.  I have reviewed the above documentation for accuracy and completeness, and I agree with the above. Dell Ponto, DO

## 2022-02-28 ENCOUNTER — Other Ambulatory Visit (HOSPITAL_COMMUNITY): Payer: Self-pay | Admitting: Internal Medicine

## 2022-02-28 ENCOUNTER — Other Ambulatory Visit: Payer: Self-pay | Admitting: Internal Medicine

## 2022-02-28 DIAGNOSIS — E213 Hyperparathyroidism, unspecified: Secondary | ICD-10-CM

## 2022-03-14 HISTORY — PX: JOINT REPLACEMENT: SHX530

## 2022-03-15 ENCOUNTER — Encounter (INDEPENDENT_AMBULATORY_CARE_PROVIDER_SITE_OTHER): Payer: Self-pay | Admitting: Family Medicine

## 2022-03-15 ENCOUNTER — Ambulatory Visit (INDEPENDENT_AMBULATORY_CARE_PROVIDER_SITE_OTHER): Payer: BC Managed Care – PPO | Admitting: Family Medicine

## 2022-03-15 VITALS — BP 125/82 | HR 68 | Temp 97.9°F | Ht 65.0 in | Wt 245.0 lb

## 2022-03-15 DIAGNOSIS — R632 Polyphagia: Secondary | ICD-10-CM

## 2022-03-15 DIAGNOSIS — E88819 Insulin resistance, unspecified: Secondary | ICD-10-CM

## 2022-03-15 DIAGNOSIS — E559 Vitamin D deficiency, unspecified: Secondary | ICD-10-CM | POA: Diagnosis not present

## 2022-03-15 DIAGNOSIS — E66813 Obesity, class 3: Secondary | ICD-10-CM

## 2022-03-15 DIAGNOSIS — Z6841 Body Mass Index (BMI) 40.0 and over, adult: Secondary | ICD-10-CM

## 2022-03-15 DIAGNOSIS — E669 Obesity, unspecified: Secondary | ICD-10-CM

## 2022-03-19 ENCOUNTER — Other Ambulatory Visit (HOSPITAL_COMMUNITY): Payer: Self-pay

## 2022-03-19 NOTE — Progress Notes (Unsigned)
Cardiology Office Note:    Date:  03/21/2022   ID:  Dana Liu, DOB 1965-07-03, MRN [id number]  PCP:  Lennie Odor, PA  Pronounced: Bur' ska vi chious (rhymes with cabbages)   [facility] Providers Cardiologist:  Werner Lean, MD     Referring MD: Lennie Odor, PA   CC: Pre-op   History of Present Illness:    Dana Liu is a 56 y.o. female with a hx of OSA, HTN, anxiety who presents for evaluation of palpitations 09/26/21.  Had negative heart monitor in interim.    Patient notes that she is doing ok.   Just found out last night she had a parathyroid adenoma (last night).  Her results have not been formally resulted. There are no interval hospital/ED visit.   Is at healthy weight and wellness and lost 30 lbs  No chest pain or pressure .  No SOB/DOE and no PND/Orthopnea.  No weight gain or leg swelling.  No palpitations or syncope.  Knee pain limits her activity.     Past Medical History:  Diagnosis Date   Anxiety    Arthritis    left knee   Back pain    Depression    Fatty liver    Heart murmur    as teenager has resolved   HPV in female    Hypertension    postpartum   Hypothyroidism    IBS (irritable bowel syndrome)    Joint pain    Morbid obesity (HCC)    Osteoarthritis    Palpitations    Pneumonia    PONV (postoperative nausea and vomiting)    Sleep apnea    Vitamin D deficiency     Past Surgical History:  Procedure Laterality Date   APPENDECTOMY     1996   COLPOSCOPY  04/08/2006   COLPOSCOPY     IUD REMOVAL N/A 05/11/2016   Procedure: INTRAUTERINE DEVICE (IUD) REMOVAL;  Surgeon: Crawford Givens, MD;  Location: [facility];  Service: Gynecology;  Laterality: N/A;   KNEE ARTHROSCOPY W/ MENISCECTOMY     LAPAROSCOPIC OVARIAN CYSTECTOMY Left 05/11/2016   Procedure: LAPAROSCOPIC OVARIAN CYSTECTOMY ,DRAINAGE LEFT OVARIAN CYST;  Surgeon: Crawford Givens, MD;  Location: [facility];  Service: Gynecology;  Laterality: Left;   WISDOM TOOTH  EXTRACTION      Current Medications: Current Meds  Medication Sig   gabapentin (NEURONTIN) 300 MG capsule Take 300 mg by mouth daily as needed.   levothyroxine (SYNTHROID, LEVOTHROID) 50 MCG tablet Take 50 mcg by mouth daily before breakfast.    losartan (COZAAR) 100 MG tablet Take 100 mg by mouth daily.   metoprolol succinate (TOPROL-XL) 50 MG 24 hr tablet Take 50 mg by mouth daily.   Multiple Vitamin (MULTIVITAMIN) capsule Take 1 capsule by mouth daily.   naproxen sodium (ANAPROX) 550 MG tablet Take 550 mg by mouth daily as needed.   Probiotic Product (PROBIOTIC PO) Take by mouth.   Turmeric (QC TUMERIC COMPLEX PO) Take by mouth.     Allergies:   Patient has no known allergies.   Social History   Socioeconomic History   Marital status: Divorced    Spouse name: Not on file   Number of children: Not on file   Years of education: Not on file   Highest education level: Not on file  Occupational History   Not on file  Tobacco Use   Smoking status: Former    Packs/day: 0.30    Years: 15.00    Total pack years:  4.50    Types: Cigarettes    Quit date: 05/16/1998    Years since quitting: 23.8   Smokeless tobacco: Never  Vaping Use   Vaping Use: Never used  Substance and Sexual Activity   Alcohol use: Yes    Alcohol/week: 1.0 standard drink of alcohol    Types: 1 Standard drinks or equivalent per week    Comment: occasional glass of wine   Drug use: No    Comment: Marijuana in the 1980's   Sexual activity: Not Currently  Other Topics Concern   Not on file  Social History Narrative   Not on file   Social Determinants of Health   Financial Resource Strain: Not on file  Food Insecurity: Not on file  Transportation Needs: Not on file  Physical Activity: Not on file  Stress: Not on file  Social Connections: Not on file    Social: former Higher education careers adviser, 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[employer]; 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 There is no history of Breast cancer, Colon cancer, Ovarian cancer, Endometrial cancer, Prostate cancer, or Pancreatic cancer. Father ha  ROS:   Please see the history of present illness.     All other systems reviewed and are negative.  EKGs/Labs/Other Studies Reviewed:    The following studies were reviewed today:  EKG:   09/26/21: Sinus bradycardia LAFB  Cardiac Studies & Procedures         MONITORS  LONG TERM MONITOR (3-14 DAYS) 10/13/2021  Narrative  Patient had a minimum heart rate of 33 bpm, maximum heart rate of 169 bpm, and average heart rate of 56 bpm.  Predominant underlying rhythm was sinus rhythm.  P-SVT (2 episodes) lasting 5 beats at longest with a max rate of 169 bpm at fastest.  Isolated PACs were rare (<1.0%).  Isolated PVCs were rare (<1.0%).  Triggered and diary events associated with sinus rhythm.  No malignant arrhythmias.            Recent Labs: 09/12/2021: TSH 2.960 11/27/2021: ALT 31; BUN 15; Creatinine, Ser 0.60; Potassium 4.5; Sodium 140  Recent Lipid Panel    Component Value Date/Time   CHOL 179 09/12/2021 0948   TRIG 159 (H) 09/12/2021 0948   HDL 49 09/12/2021 0948   LDLCALC 102 (H) 09/12/2021 0948    Physical Exam:    VS:  BP 112/60   Pulse 64   Ht '5\' 5"'$  (1.651 m)   Wt 245 lb 12.8 oz (111.5 kg)   SpO2 97%   BMI 40.90 kg/m     Wt Readings from Last 3 Encounters:  03/21/22 245 lb 12.8 oz (111.5 kg)  03/15/22 245 lb  (111.1 kg)  02/14/22 240 lb (108.9 kg)    Gen: no distress, morbid obesity   Neck: No JVD Ears:  Pilar Plate Sign Cardiac: No rubs or gallops, no murmur, RRR +2 radial pulses Respiratory: Clear to auscultation bilaterally, normal effort, normal  respiratory rate GI: Soft, nontender, non-distended  MS: No  edema;  moves all extremities Integument: Skin feels warm Neuro:  At time of evaluation, alert and oriented to person/place/time/situation  Psych: Normal affect, patient feels    ASSESSMENT:    1. Pre-op evaluation   2. Essential hypertension   3. Mixed hyperlipidemia   4. Morbid obesity ([place])   5. Other specified hypothyroidism     PLAN:    Pre-op New parathyroid adenoma - low risk from a cardiac perspective  HTN - Controled on losartan and metoprolol  HLD Morbid obesity Family history of early CAD - LDL 102 - continue health weight and wellness - she will call before her 6 month f/u if she wants CAC score - we may more to PRN after next visit - continue at health weight and wellness  Hypothyroidism  - no change in therapy             Medication Adjustments/Labs and Tests Ordered: Current medicines are reviewed at length with the patient today.  Concerns regarding medicines are outlined above.  No orders of the defined types were placed in this encounter.  No orders of the defined types were placed in this encounter.   Patient Instructions  Medication Instructions:  Your physician recommends that you continue on your current medications as directed. Please refer to the Current Medication list given to you today.  *If you need a refill on your cardiac medications before your next appointment, please call your pharmacy*   Lab Work: NONE If you have labs (blood work) drawn today and your tests are completely normal, you will receive your results only by: [place] (if you have MyChart) OR A paper copy in the mail If you have any lab test that is  abnormal or we need to change your treatment, we will call you to review the results.   Testing/Procedures: NONE   Follow-Up: At [facility], you and your health needs are our priority.  As part of our continuing mission to provide you with exceptional heart care, we have created designated Provider Care Teams.  These Care Teams include your primary Cardiologist (physician) and Advanced Practice Providers (APPs -  Physician Assistants and Nurse Practitioners) who all work together to provide you with the care you need, when you need it.  Your next appointment:   6 month(s)  The format for your next appointment:   In Person  Provider:   Werner Lean, MD      Important Information About Sugar         Signed, Werner Lean, MD  03/21/2022 9:04 AM    [place]

## 2022-03-20 ENCOUNTER — Encounter (HOSPITAL_COMMUNITY)
Admission: RE | Admit: 2022-03-20 | Discharge: 2022-03-20 | Disposition: A | Discharge status: Home / Self Care | Payer: BC Managed Care – PPO | Source: Ambulatory Visit | Attending: Internal Medicine | Admitting: Internal Medicine

## 2022-03-20 DIAGNOSIS — E213 Hyperparathyroidism, unspecified: Secondary | ICD-10-CM | POA: Diagnosis present

## 2022-03-20 MED ORDER — TECHNETIUM TC 99M SESTAMIBI - CARDIOLITE
23.2000 | Freq: Once | INTRAVENOUS | Status: AC | PRN
  Administered 2022-03-20: 23.2 via INTRAVENOUS

## 2022-03-21 ENCOUNTER — Encounter: Payer: Self-pay | Admitting: Internal Medicine

## 2022-03-21 ENCOUNTER — Ambulatory Visit: Payer: BC Managed Care – PPO | Attending: Internal Medicine | Admitting: Internal Medicine

## 2022-03-21 VITALS — BP 112/60 | HR 64 | Ht 65.0 in | Wt 245.8 lb

## 2022-03-21 DIAGNOSIS — Z01818 Encounter for other preprocedural examination: Secondary | ICD-10-CM | POA: Diagnosis not present

## 2022-03-21 DIAGNOSIS — E782 Mixed hyperlipidemia: Secondary | ICD-10-CM | POA: Diagnosis not present

## 2022-03-21 DIAGNOSIS — E038 Other specified hypothyroidism: Secondary | ICD-10-CM

## 2022-03-21 DIAGNOSIS — I10 Essential (primary) hypertension: Secondary | ICD-10-CM

## 2022-03-21 NOTE — Patient Instructions (Signed)
Medication Instructions:  Your physician recommends that you continue on your current medications as directed. Please refer to the Current Medication list given to you today.  *If you need a refill on your cardiac medications before your next appointment, please call your pharmacy*   Lab Work: NONE If you have labs (blood work) drawn today and your tests are completely normal, you will receive your results only by: [place] (if you have MyChart) OR A paper copy in the mail If you have any lab test that is abnormal or we need to change your treatment, we will call you to review the results.   Testing/Procedures: NONE   Follow-Up: At [facility], you and your health needs are our priority.  As part of our continuing mission to provide you with exceptional heart care, we have created designated Provider Care Teams.  These Care Teams include your primary Cardiologist (physician) and Advanced Practice Providers (APPs -  Physician Assistants and Nurse Practitioners) who all work together to provide you with the care you need, when you need it.  Your next appointment:   6 month(s)  The format for your next appointment:   In Person  Provider:   Werner Lean, MD      Important Information About Sugar

## 2022-03-27 ENCOUNTER — Encounter (INDEPENDENT_AMBULATORY_CARE_PROVIDER_SITE_OTHER): Payer: Self-pay

## 2022-03-28 NOTE — Telephone Encounter (Signed)
Please call pt to schedule appt this week. See above message

## 2022-03-28 NOTE — Progress Notes (Signed)
Chief Complaint:   OBESITY Dana Liu is here to discuss her progress with her obesity treatment plan along with follow-up of her obesity related diagnoses. Dana Liu is on keeping a food journal and adhering to recommended goals of 1600 calories and 100 protein and states she is following her eating plan approximately 85% of the time. Dana Liu states she is swimming, water walking 30-60 minutes 1-2 times per week.  Today's visit was #: 9 Starting weight: 260 lbs Starting date: 09/12/2021 Today's weight: 245 lbs Today's date: 03/15/2022 Total lbs lost to date: 15 lbs Total lbs lost since last in-office visit: +5 lbs  Interim History: Left knee arthritis pain ha been worse.  Plans to have total knee replacement done this month.  Trying to keep BMI <40 for surgery.  Doing water exercise 1-2 times per week.  Has been off of Wegovy for 6 weeks due to national drug shortages.  She has been journaling 3-4 times per week.  She has cut back on water intake at work.   Subjective:   1. Vitamin D deficiency She has been off of Vitamin D due to hypercalcemia.  Seeing Dr Meredith Pel.   2. Insulin resistance Fasting insulin 21.9.  She has cut back on sugar intake.   3. Polyphagia She has been off Wegovy.  She saw an increase in hunger being off Dana Liu for 5-6 weeks with some weight regain, but did gain 3 lbs of muscle in the past month with increased protein intake.   Assessment/Plan:   1. Vitamin D deficiency Endocrinology to follow.   2. Insulin resistance She has been unable to acquire GLP-1 agonist due to national drug shortage.  Consider  Metformin in the future.   3. Polyphagia Look for improving hunger on higher protein diet with decreased sugar intake.   4. Obesity, current BMI 40.9 1) Replace one meal per day with a protein meal replacement shake. 2) Log daily intake with 1500 calorie per day goal. 3) Increase water exercise to 3 times per week. 4) Increase water intake to 90 oz per day.    Dana Liu is currently in the action stage of change. As such, her goal is to continue with weight loss efforts. She has agreed to keeping a food journal and adhering to recommended goals of 1600 calories and 100 protein.   Exercise goals:  Increase water exercise to 3 times per week.   Behavioral modification strategies: increasing lean protein intake, increasing water intake, decreasing eating out, no skipping meals, keeping healthy foods in the home, keeping a strict food journal, and decreasing junk food.  Dana Liu has agreed to follow-up with our clinic in 5 weeks. She was informed of the importance of frequent follow-up visits to maximize her success with intensive lifestyle modifications for her multiple health conditions.   Objective:   Blood pressure 125/82, pulse 68, temperature 97.9 F (36.6 C), height '5\' 5"'$  (7.425 m), weight 245 lb (111.1 kg), SpO2 98 %. Body mass index is 40.77 kg/m.  General: Cooperative, alert, well developed, in no acute distress. HEENT: Conjunctivae and lids unremarkable. Cardiovascular: Regular rhythm.  Lungs: Normal work of breathing. Neurologic: No focal deficits.   Lab Results  Component Value Date   CREATININE 0.60 11/27/2021   BUN 15 11/27/2021   NA 140 11/27/2021   K 4.5 11/27/2021   CL 103 11/27/2021   CO2 23 11/27/2021   Lab Results  Component Value Date   ALT 31 11/27/2021   AST 18 11/27/2021  ALKPHOS 111 11/27/2021   BILITOT <0.2 11/27/2021   Lab Results  Component Value Date   HGBA1C 5.5 09/12/2021   Lab Results  Component Value Date   INSULIN 21.9 09/12/2021   Lab Results  Component Value Date   TSH 2.960 09/12/2021   Lab Results  Component Value Date   CHOL 179 09/12/2021   HDL 49 09/12/2021   LDLCALC 102 (H) 09/12/2021   TRIG 159 (H) 09/12/2021   Lab Results  Component Value Date   VD25OH 35.2 11/27/2021   VD25OH 33.5 09/12/2021   Lab Results  Component Value Date   WBC 9.9 05/03/2016   HGB 14.8 05/03/2016    HCT 43.5 05/03/2016   MCV 89.7 05/03/2016   PLT 277 05/03/2016   No results found for: "IRON", "TIBC", "FERRITIN"  Attestation Statements:   Reviewed by clinician on day of visit: allergies, medications, problem list, medical history, surgical history, family history, social history, and previous encounter notes.  I, Davy Pique, am acting as Location manager for Loyal Gambler, DO.  I have reviewed the above documentation for accuracy and completeness, and I agree with the above. Dell Ponto, DO

## 2022-03-28 NOTE — Telephone Encounter (Signed)
Please advise 

## 2022-04-02 ENCOUNTER — Other Ambulatory Visit (HOSPITAL_COMMUNITY): Payer: Self-pay

## 2022-04-02 MED ORDER — OXYCODONE HCL 5 MG PO TABS
5.0000 mg | ORAL_TABLET | Freq: Four times a day (QID) | ORAL | 0 refills | Status: DC | PRN
  Filled 2022-04-02: qty 42, 5d supply, fill #0

## 2022-04-02 MED ORDER — TRAMADOL HCL 50 MG PO TABS
50.0000 mg | ORAL_TABLET | Freq: Three times a day (TID) | ORAL | 0 refills | Status: DC | PRN
  Filled 2022-04-02: qty 40, 7d supply, fill #0

## 2022-04-11 ENCOUNTER — Other Ambulatory Visit (HOSPITAL_COMMUNITY): Payer: Self-pay

## 2022-04-11 MED ORDER — TRAMADOL HCL 50 MG PO TABS
50.0000 mg | ORAL_TABLET | Freq: Three times a day (TID) | ORAL | 0 refills | Status: DC | PRN
  Filled 2022-04-11: qty 40, 7d supply, fill #0

## 2022-04-19 ENCOUNTER — Other Ambulatory Visit (HOSPITAL_COMMUNITY): Payer: Self-pay

## 2022-04-19 ENCOUNTER — Telehealth (INDEPENDENT_AMBULATORY_CARE_PROVIDER_SITE_OTHER): Payer: BC Managed Care – PPO | Admitting: Family Medicine

## 2022-04-19 ENCOUNTER — Encounter (INDEPENDENT_AMBULATORY_CARE_PROVIDER_SITE_OTHER): Payer: Self-pay | Admitting: Family Medicine

## 2022-04-19 DIAGNOSIS — E88819 Insulin resistance, unspecified: Secondary | ICD-10-CM | POA: Diagnosis not present

## 2022-04-19 DIAGNOSIS — E669 Obesity, unspecified: Secondary | ICD-10-CM | POA: Diagnosis not present

## 2022-04-19 DIAGNOSIS — M1712 Unilateral primary osteoarthritis, left knee: Secondary | ICD-10-CM | POA: Diagnosis not present

## 2022-04-19 DIAGNOSIS — Z6841 Body Mass Index (BMI) 40.0 and over, adult: Secondary | ICD-10-CM

## 2022-04-19 MED ORDER — TRAMADOL HCL 50 MG PO TABS
50.0000 mg | ORAL_TABLET | Freq: Four times a day (QID) | ORAL | 0 refills | Status: DC
  Filled 2022-04-19: qty 40, 5d supply, fill #0

## 2022-04-26 NOTE — Progress Notes (Signed)
TeleHealth Visit:  Due to the COVID-19 pandemic, this visit was completed with telemedicine (audio/video) technology to reduce patient and provider exposure as well as to preserve personal protective equipment.   [name] has verbally consented to this TeleHealth visit. The patient is located at home, the provider is located at the [facility] office. The participants in this visit include the listed provider and patient. The visit was conducted today via video.  Chief Complaint: OBESITY Dana Liu is here to discuss her progress with her obesity treatment plan along with follow-up of her obesity related diagnoses. Dana Liu is on keeping a food journal and adhering to recommended goals of 1600 calories and 100 protein and states she is following her eating plan approximately 90 to 100% of the time. Dana Liu states she is PT 3 times per week.  Today's visit was #: 10 Starting weight: 52 LBS Starting date: 09/12/2021  Interim History: S/p total knee replacement 2 weeks ago.  She is feeling well and has started PT, she will go 3 times a week.  She is not cleared for exercise yet.  She is logging daily intake on my fitness pal.  She is hydrating well but struggling with sleep.  Has been supplementing with protein shakes and protein bars.  Subjective:   1. Insulin resistance Fasting insulin 21.9.  She has reduced intake of added sugar.  She has started PT for activity.  2. Osteoarthritis of left knee, unspecified osteoarthritis type S/p, total knee replacement 2 weeks ago.  She is weaning off pain medication.  She is in PT 3 times per week.  Assessment/Plan:   1. Insulin resistance Consider addition of metformin.  2. Osteoarthritis of left knee, unspecified osteoarthritis type Anticipate improved knee pain and improve movement as she heals from left total knee replacement  3. Obesity,current BMI 40.7 Aim for a goal of 100 g of protein daily for wound healing and muscle gain.  Dana Liu  is currently in the action stage of change. As such, her goal is to continue with weight loss efforts. She has agreed to keeping a food journal and adhering to recommended goals of 1600 calories and 100 protein.   Exercise goals:  PT.  Behavioral modification strategies: increasing lean protein intake, increasing water intake, keeping healthy foods in the home, better snacking choices, planning for success, and decreasing junk food.  Dana Liu has agreed to follow-up with our clinic in 4 weeks. She was informed of the importance of frequent follow-up visits to maximize her success with intensive lifestyle modifications for her multiple health conditions.  Objective:   VITALS: Per patient if applicable, see vitals. GENERAL: Alert and in no acute distress. CARDIOPULMONARY: No increased WOB. Speaking in clear sentences.  PSYCH: Pleasant and cooperative. Speech normal rate and rhythm. Affect is appropriate. Insight and judgement are appropriate. Attention is focused, linear, and appropriate.  NEURO: Oriented as arrived to appointment on time with no prompting.   Lab Results  Component Value Date   CREATININE 0.60 11/27/2021   BUN 15 11/27/2021   NA 140 11/27/2021   K 4.5 11/27/2021   CL 103 11/27/2021   CO2 23 11/27/2021   Lab Results  Component Value Date   ALT 31 11/27/2021   AST 18 11/27/2021   ALKPHOS 111 11/27/2021   BILITOT <0.2 11/27/2021   Lab Results  Component Value Date   HGBA1C 5.5 09/12/2021   Lab Results  Component Value Date   INSULIN 21.9 09/12/2021   Lab Results  Component Value Date   TSH 2.960 09/12/2021   Lab Results  Component Value Date   CHOL 179 09/12/2021   HDL 49 09/12/2021   LDLCALC 102 (H) 09/12/2021   TRIG 159 (H) 09/12/2021   Lab Results  Component Value Date   VD25OH 35.2 11/27/2021   VD25OH 33.5 09/12/2021   Lab Results  Component Value Date   WBC 9.9 05/03/2016   HGB 14.8 05/03/2016   HCT 43.5 05/03/2016   MCV 89.7 05/03/2016    PLT 277 05/03/2016   No results found for: "IRON", "TIBC", "FERRITIN"  Attestation Statements:   Reviewed by clinician on day of visit: allergies, medications, problem list, medical history, surgical history, family history, social history, and previous encounter notes.  I, Davy Pique, am acting as transcriptionist for Loyal Gambler, DO.  ]I have reviewed the above documentation for accuracy and completeness, and I agree with the above. Dell Ponto, DO

## 2022-05-12 ENCOUNTER — Other Ambulatory Visit (HOSPITAL_COMMUNITY): Payer: Self-pay

## 2022-05-15 ENCOUNTER — Other Ambulatory Visit (HOSPITAL_COMMUNITY): Payer: Self-pay

## 2022-05-15 MED ORDER — TRAMADOL HCL 50 MG PO TABS
ORAL_TABLET | ORAL | 0 refills | Status: DC
  Filled 2022-05-15: qty 40, 5d supply, fill #0

## 2022-06-21 ENCOUNTER — Encounter (INDEPENDENT_AMBULATORY_CARE_PROVIDER_SITE_OTHER): Payer: Self-pay | Admitting: Family Medicine

## 2022-06-21 ENCOUNTER — Ambulatory Visit (INDEPENDENT_AMBULATORY_CARE_PROVIDER_SITE_OTHER): Payer: BC Managed Care – PPO | Admitting: Family Medicine

## 2022-06-21 VITALS — BP 128/82 | HR 64 | Temp 97.8°F | Ht 65.0 in

## 2022-06-21 DIAGNOSIS — E88819 Insulin resistance, unspecified: Secondary | ICD-10-CM | POA: Diagnosis not present

## 2022-06-21 DIAGNOSIS — M1711 Unilateral primary osteoarthritis, right knee: Secondary | ICD-10-CM | POA: Diagnosis not present

## 2022-06-21 DIAGNOSIS — Z6839 Body mass index (BMI) 39.0-39.9, adult: Secondary | ICD-10-CM | POA: Diagnosis not present

## 2022-06-25 ENCOUNTER — Ambulatory Visit (INDEPENDENT_AMBULATORY_CARE_PROVIDER_SITE_OTHER): Payer: BC Managed Care – PPO | Admitting: Family Medicine

## 2022-06-27 ENCOUNTER — Ambulatory Visit: Payer: Self-pay | Admitting: Surgery

## 2022-06-28 ENCOUNTER — Other Ambulatory Visit: Payer: Self-pay | Admitting: Surgery

## 2022-06-28 DIAGNOSIS — E210 Primary hyperparathyroidism: Secondary | ICD-10-CM

## 2022-07-10 NOTE — Progress Notes (Signed)
Chief Complaint:   OBESITY Dana Liu is here to discuss her progress with her obesity treatment plan along with follow-up of her obesity related diagnoses. Dana Liu is on keeping a food journal and adhering to recommended goals of 1600 calories and 100 protein and states she is following her eating plan approximately 75-80% of the time. Dana Liu states she is pool, swimming 30-60 minutes 1-2 times per week.  Today's visit was #: 11 Starting weight: 260 lbs Starting date: 09/12/2021 Today's weight: 238 lbs Today's date: 06/21/2022 Total lbs lost to date: 22 lbs Total lbs lost since last in-office visit: 7 lbs  Interim History: Patient is back to work following a left total knee replacement.  Finished physical therapy.  Patient is getting in water exercise 1-2 times per week.  She has work stress and allergies.  She is getting in meals, but lacks protein.  She has added a protein shake or bar.  Subjective:   1. Insulin resistance Fasting insulin 21.9 on 09/12/2021.  Patient has been actively reducing intake of sugar.  2. Osteoarthritis of right knee, unspecified osteoarthritis type S/p total knee replacement in November, she is feeling well.  She has completed physical therapy, but has been inconsistent with home PT exercises.  She is able to walk more.  Assessment/Plan:   1. Insulin resistance Consider adding metformin.  Increase walking time and swimming to 3 times per week.   2. Osteoarthritis of right knee, unspecified osteoarthritis type Increase home PT exercises to strengthen muscles around left knee.  Plan to increase outdoor walking by spring.   3. Morbid obesity (HCC)  4. Obesity,current BMI 39.6 1.  Increase exercise to 3-4 times per week. 2.  Consistent home PT exercises daily.  Dana Liu is currently in the action stage of change. As such, her goal is to continue with weight loss efforts. She has agreed to keeping a food journal and adhering to recommended goals of 1600  calories and 100 protein daily.   Exercise goals: All adults should avoid inactivity. Some physical activity is better than none, and adults who participate in any amount of physical activity gain some health benefits.  Behavioral modification strategies: increasing lean protein intake, increasing vegetables, increasing water intake, decreasing eating out, no skipping meals, meal planning and cooking strategies, keeping healthy foods in the home, and planning for success.  Dana Liu has agreed to follow-up with our clinic in 4 weeks. She was informed of the importance of frequent follow-up visits to maximize her success with intensive lifestyle modifications for her multiple health conditions.   Objective:   Blood pressure 128/82, pulse 64, temperature 97.8 F (36.6 C), height '5\' 5"'$  (1.651 m), SpO2 98 %. Body mass index is 40.9 kg/m.  General: Cooperative, alert, well developed, in no acute distress. HEENT: Conjunctivae and lids unremarkable. Cardiovascular: Regular rhythm.  Lungs: Normal work of breathing. Neurologic: No focal deficits.   Lab Results  Component Value Date   CREATININE 0.60 11/27/2021   BUN 15 11/27/2021   NA 140 11/27/2021   K 4.5 11/27/2021   CL 103 11/27/2021   CO2 23 11/27/2021   Lab Results  Component Value Date   ALT 31 11/27/2021   AST 18 11/27/2021   ALKPHOS 111 11/27/2021   BILITOT <0.2 11/27/2021   Lab Results  Component Value Date   HGBA1C 5.5 09/12/2021   Lab Results  Component Value Date   INSULIN 21.9 09/12/2021   Lab Results  Component Value Date   TSH 2.960  09/12/2021   Lab Results  Component Value Date   CHOL 179 09/12/2021   HDL 49 09/12/2021   LDLCALC 102 (H) 09/12/2021   TRIG 159 (H) 09/12/2021   Lab Results  Component Value Date   VD25OH 35.2 11/27/2021   VD25OH 33.5 09/12/2021   Lab Results  Component Value Date   WBC 9.9 05/03/2016   HGB 14.8 05/03/2016   HCT 43.5 05/03/2016   MCV 89.7 05/03/2016   PLT 277  05/03/2016   No results found for: "IRON", "TIBC", "FERRITIN"  Attestation Statements:   Reviewed by clinician on day of visit: allergies, medications, problem list, medical history, surgical history, family history, social history, and previous encounter notes.  Time spent on visit including pre-visit chart review and post-visit care and charting was 30 minutes.   I, Davy Pique, am acting as Location manager for Loyal Gambler, DO.  I have reviewed the above documentation for accuracy and completeness, and I agree with the above. Dell Ponto, DO

## 2022-07-18 ENCOUNTER — Encounter (INDEPENDENT_AMBULATORY_CARE_PROVIDER_SITE_OTHER): Payer: Self-pay | Admitting: Family Medicine

## 2022-07-18 ENCOUNTER — Ambulatory Visit (INDEPENDENT_AMBULATORY_CARE_PROVIDER_SITE_OTHER): Payer: BC Managed Care – PPO | Admitting: Family Medicine

## 2022-07-18 VITALS — BP 133/84 | HR 80 | Temp 97.8°F | Ht 65.0 in | Wt 246.0 lb

## 2022-07-18 DIAGNOSIS — M1711 Unilateral primary osteoarthritis, right knee: Secondary | ICD-10-CM | POA: Diagnosis not present

## 2022-07-18 DIAGNOSIS — E88819 Insulin resistance, unspecified: Secondary | ICD-10-CM | POA: Diagnosis not present

## 2022-07-18 DIAGNOSIS — Z6841 Body Mass Index (BMI) 40.0 and over, adult: Secondary | ICD-10-CM

## 2022-07-18 NOTE — Progress Notes (Unsigned)
Office: [phone]  /  Fax: [phone]  WEIGHT SUMMARY AND BIOMETRICS  Vitals Temp: 97.8 F (36.6 C) BP: 133/84 Pulse Rate: 80 SpO2: 95 %   Anthropometric Measurements Height: '5\' 5"'$  (1.651 m) Weight: 246 lb (111.6 kg) BMI (Calculated): 40.94 Weight at Last Visit: 238lb Weight Lost Since Last Visit: +8 Starting Weight: 260lb Total Weight Loss (lbs): 14 lb (6.35 kg)   Body Composition  Body Fat %: 51.8 % Fat Mass (lbs): 127.8 lbs Muscle Mass (lbs): 113 lbs Visceral Fat Rating : 16   Other Clinical Data Fasting: 75 Labs: no Today's Visit #: 17 Starting Date: 09/12/21   HPI  Chief Complaint: OBESITY  Laurrie is here to discuss her progress with her obesity treatment plan. She is on the keeping a food journal and adhering to recommended goals of 1600 calories and 100 protein and states she is following her eating plan approximately 75 % of the time. She states she is exercising 30-60 minutes 3-4 times per week.   Interval History:  Since last office visit she is up 8 pounds since her last visit.  This gives her a net weight loss of 14 pounds since starting our program May 2023.  She has been in physical therapy status post left TKR. She plans to have R TKR in the future but is not rushing it.   She is doing water and home PT exercises and riding the recumbent bike and using the leg press at the [employer]. She is logging her daily intake most days of the week. She has been nervous about having a partial parathyroidectomy for a mass.  She has to stay off vitamin D supplement.  Was picking a high calorie/ high sugar protein shake.   Pharmacotherapy: none  PHYSICAL EXAM:  Blood pressure 133/84, pulse 80, temperature 97.8 F (36.6 C), height '5\' 5"'$  (1.651 m), weight 246 lb (111.6 kg), SpO2 95 %. Body mass index is 40.94 kg/m.  General: She is overweight, cooperative, alert, well developed, and in no acute distress. PSYCH: Has normal mood, affect and thought process.    Lungs: Normal breathing effort, no conversational dyspnea.  DIAGNOSTIC DATA REVIEWED:  BMET    Component Value Date/Time   NA 140 11/27/2021 1625   K 4.5 11/27/2021 1625   CL 103 11/27/2021 1625   CO2 23 11/27/2021 1625   GLUCOSE 91 11/27/2021 1625   BUN 15 11/27/2021 1625   CREATININE 0.60 11/27/2021 1625   CALCIUM 11.4 (H) 11/27/2021 1625   Lab Results  Component Value Date   HGBA1C 5.5 09/12/2021   Lab Results  Component Value Date   INSULIN 21.9 09/12/2021   Lab Results  Component Value Date   TSH 2.960 09/12/2021   CBC    Component Value Date/Time   WBC 9.9 05/03/2016 1220   RBC 4.85 05/03/2016 1220   HGB 14.8 05/03/2016 1220   HCT 43.5 05/03/2016 1220   PLT 277 05/03/2016 1220   MCV 89.7 05/03/2016 1220   MCH 30.5 05/03/2016 1220   MCHC 34.0 05/03/2016 1220   RDW 12.7 05/03/2016 1220   Iron Studies No results found for: "IRON", "TIBC", "FERRITIN", "IRONPCTSAT" Lipid Panel     Component Value Date/Time   CHOL 179 09/12/2021 0948   TRIG 159 (H) 09/12/2021 0948   HDL 49 09/12/2021 0948   LDLCALC 102 (H) 09/12/2021 0948   Hepatic Function Panel     Component Value Date/Time   PROT 6.8 11/27/2021 1625   ALBUMIN 4.4 11/27/2021  1625   AST 18 11/27/2021 1625   ALT 31 11/27/2021 1625   ALKPHOS 111 11/27/2021 1625   BILITOT <0.2 11/27/2021 1625      Component Value Date/Time   TSH 2.960 09/12/2021 0948   Nutritional Lab Results  Component Value Date   VD25OH 35.2 11/27/2021   VD25OH 33.5 09/12/2021     ASSESSMENT AND PLAN  TREATMENT PLAN FOR OBESITY:  Recommended Dietary Goals  Laurielle is currently in the action stage of change. As such, her goal is to continue weight management plan. She has agreed to {MWMwtlossportion/plan2:23431}.  Behavioral Intervention  We discussed the following Behavioral Modification Strategies today: {EMWMwtlossstrategies:28914::"increasing lean protein intake","increasing vegetables","increasing water  intake","work on meal planning and easy cooking plans"}.  Additional resources provided today: NA  Recommended Physical Activity Goals  Michealle has been advised to work up to 150 minutes of moderate intensity aerobic activity a week and strengthening exercises 2-3 times per week for cardiovascular health, weight loss maintenance and preservation of muscle mass.   She has agreed to {EMEXERCISE:28847::"increase physical activity in their day and reduce sedentary time (increase NEAT). "}   Pharmacotherapy We discussed various medication options to help Johnie with her weight loss efforts and we both agreed to ***.  ASSOCIATED CONDITIONS ADDRESSED TODAY  Osteoarthritis of right knee, unspecified osteoarthritis type  Morbid obesity (HCC)  BMI 40.0-44.9, adult (HCC)  Insulin resistance      No follow-ups on file.Marland Kitchen She was informed of the importance of frequent follow up visits to maximize her success with intensive lifestyle modifications for her multiple health conditions.   ATTESTASTION STATEMENTS:  Reviewed by clinician on day of visit: allergies, medications, problem list, medical history, surgical history, family history, social history, and previous encounter notes pertinent to obesity diagnosis.   I have personally spent 30 minutes total time today in preparation, patient care, nutritional counseling and documentation for this visit, including the following: review of clinical lab tests; review of medical tests/procedures/services.      Dell Ponto, DO DABFM, DABOM [facility] [address] [place], [place] [address] [phone]

## 2022-07-19 DIAGNOSIS — M1711 Unilateral primary osteoarthritis, right knee: Secondary | ICD-10-CM | POA: Insufficient documentation

## 2022-07-19 NOTE — Assessment & Plan Note (Signed)
Fasting insulin 21.9 in May 2023.  She has actively been working on weight reduction, reduced sugar intake but exercise has been limited after having left TKR.  Plan increase exercise time when she finishes PT.  Consider use of metformin.

## 2022-07-19 NOTE — Assessment & Plan Note (Signed)
She is holding off on right TKR after having left TKR last year.  She is still in physical therapy but is seeing some progress.  She plans to add in some more water exercise.  Continue actively working on weight reduction, looking for reduction of knee pain.  We discussed exercises that would put less pressure on her right knee.  She has access to the [facility] pool and weight room.

## 2022-07-24 ENCOUNTER — Ambulatory Visit
Admission: RE | Admit: 2022-07-24 | Discharge: 2022-07-24 | Disposition: A | Discharge status: Home / Self Care | Payer: BC Managed Care – PPO | Source: Ambulatory Visit | Attending: Surgery | Admitting: Surgery

## 2022-07-24 DIAGNOSIS — E210 Primary hyperparathyroidism: Secondary | ICD-10-CM

## 2022-07-27 NOTE — Progress Notes (Signed)
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  Patient is a good candidate for minimally invasive parathyroidectomy as an outpatient procedure.  Armandina Gemma, MD [facility] A [place] practice Office: [phone]

## 2022-07-29 ENCOUNTER — Encounter: Payer: Self-pay | Admitting: Surgery

## 2022-07-29 DIAGNOSIS — E210 Primary hyperparathyroidism: Secondary | ICD-10-CM | POA: Diagnosis present

## 2022-07-29 NOTE — H&P (Signed)
REFERRING PHYSICIAN: Hoyt Koch, MD  PROVIDER: Sissi Padia Charlotta Newton, MD   Chief Complaint: New Consultation ( Hyperparathyroidism)  History of Present Illness:  Patient is referred by Dr. Alanson Aly for surgical evaluation and management of newly diagnosed primary hyperparathyroidism. Patient was first noted on routine laboratory studies to have an elevated serum calcium level in October 2022. Recent laboratory studies show a calcium level as high as 11.4 and an unsuppressed intact PTH level of 41. 25-hydroxy vitamin D level was normal at 35.2. Patient underwent a nuclear medicine parathyroid scan on March 20, 2022. This localized a right inferior parathyroid adenoma. Patient has not had further imaging studies. She has had no prior head or neck surgery. She is on levothyroxine for hypothyroidism. There is no family history of parathyroid disease or other endocrine neoplasms. Patient is symptomatic. She notes chronic fatigue. She has issues with memory and confusion. She has had bone and joint discomfort. She has not had a bone density scan. 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  Review of Systems: A complete review of systems was obtained from the patient. I have reviewed this information and discussed as appropriate with the patient. See HPI as well for other ROS.  Review of Systems Constitutional: Positive for malaise/fatigue and weight loss. HENT: Negative. Eyes: Negative. Respiratory: Negative. Cardiovascular: Negative. Gastrointestinal: Negative. Genitourinary: Negative. Musculoskeletal: Positive for joint pain. Skin: Negative. Neurological: Negative. Endo/Heme/Allergies: Negative. Psychiatric/Behavioral: Positive for memory loss.   Medical History: History reviewed. No pertinent past medical history.  Patient Active Problem List Diagnosis Primary hyperparathyroidism (CMS-HCC)  History reviewed. No pertinent  surgical history.  Not on File  No current outpatient medications on file prior to visit.  No current facility-administered medications on file prior to visit.  History reviewed. No pertinent family history.  Social History  Tobacco Use Smoking Status Not on file Smokeless Tobacco Not on file   Social History  Socioeconomic History Marital status: Divorced  Objective:  Physical Exam  GENERAL APPEARANCE Comfortable, no acute issues Development: normal Gross deformities: none  SKIN Rash, lesions, ulcers: none Induration, erythema: none Nodules: none palpable  EYES Conjunctiva and lids: normal Pupils: equal and reactive  EARS, NOSE, MOUTH, THROAT External ears: no lesion or deformity External nose: no lesion or deformity Hearing: grossly normal  NECK Symmetric: yes Trachea: midline Thyroid: no palpable nodules in the thyroid bed  CHEST Respiratory effort: normal Retraction or accessory muscle use: no Breath sounds: normal bilaterally Rales, rhonchi, wheeze: none  CARDIOVASCULAR Auscultation: regular rhythm, normal rate Murmurs: none Pulses: radial pulse 2+ palpable Lower extremity edema: none  ABDOMEN Not assessed  GENITOURINARY/RECTAL Not assessed  MUSCULOSKELETAL Station and gait: normal Digits and nails: no clubbing or cyanosis Muscle strength: grossly normal all extremities Range of motion: grossly normal all extremities Deformity: none  LYMPHATIC Cervical: none palpable Supraclavicular: none palpable  PSYCHIATRIC Oriented to person, place, and time: yes Mood and affect: normal for situation Judgment and insight: appropriate for situation   Assessment and Plan:  Primary hyperparathyroidism (CMS-HCC)  Patient is referred by her endocrinologist for surgical evaluation and management of primary hyperparathyroidism.  Patient provided with a copy of "Parathyroid Surgery: Treatment for Your Parathyroid Gland Problem", published by  [employer], 12 pages. Book reviewed and explained to patient during visit today.  Today we reviewed her clinical history, her laboratory studies, and her most recent imaging studies. Patient appears to have a right inferior parathyroid adenoma. I have recommended proceeding  with minimally invasive parathyroidectomy as an outpatient surgical procedure. We have discussed the surgery, the size and location of the surgical incision, the risk of recurrent laryngeal nerve injury, and the postoperative recovery and returned to work and activities. I would like to obtain an ultrasound examination of the neck prior to surgery in hopes of confirming the location of the adenoma and to rule out any concurrent thyroid disease. Patient understands and agrees to proceed with ultrasound followed by scheduling of her surgery at [facility] in the near future.  Armandina Gemma, MD [facility] A [place] practice Office: [phone]

## 2022-07-30 NOTE — Patient Instructions (Signed)
SURGICAL WAITING ROOM VISITATION Patients having surgery or a procedure may have no more than 2 support people in the waiting area - these visitors may rotate in the visitor waiting room.   Due to an increase in RSV and influenza rates and associated hospitalizations, children ages 69 and under may not visit patients in [place]. If the patient needs to stay at the hospital during part of their recovery, the visitor guidelines for inpatient rooms apply.  PRE-OP VISITATION  Pre-op nurse will coordinate an appropriate time for 1 support person to accompany the patient in pre-op.  This support person may not rotate.  This visitor will be contacted when the time is appropriate for the visitor to come back in the pre-op area.  Please refer to the [facility] website for the visitor guidelines for Inpatients (after your surgery is over and you are in a regular room).  You are not required to quarantine at this time prior to your surgery. However, you must do this: Hand Hygiene often Do NOT share personal items Notify your provider if you are in close contact with someone who has COVID or you develop fever 100.4 or greater, new onset of sneezing, cough, sore throat, shortness of breath or body aches.  If you test positive for Covid or have been in contact with anyone that has tested positive in the last 10 days please notify you surgeon.    Your procedure is scheduled on:  Friday  August 03, 2022  Report to [facility] Main Entrance: [place] entrance where the [employer] is available.   Report to admitting at: 07:00    AM  +++++Call this number if you have any questions or problems the morning of surgery [phone]  Do not eat food after Midnight the night prior to your surgery/procedure.  After Midnight you may have the following liquids until   06:15  AM DAY OF SURGERY  Clear Liquid Diet Water Black Coffee (sugar ok, NO MILK/CREAM OR CREAMERS)  Tea (sugar ok, NO  MILK/CREAM OR CREAMERS) regular and decaf                             Plain Jell-O  with no fruit (NO RED)                                           Fruit ices (not with fruit pulp, NO RED)                                     Popsicles (NO RED)                                                                  Juice: apple, WHITE grape, WHITE cranberry Sports drinks like Gatorade or Powerade (NO RED)                 FOLLOW ANY ADDITIONAL PRE OP INSTRUCTIONS YOU RECEIVED FROM YOUR SURGEON'S OFFICE!!!   Oral Hygiene is also important to reduce your risk of  infection.        Remember - BRUSH YOUR TEETH THE MORNING OF SURGERY WITH YOUR REGULAR TOOTHPASTE  Do NOT smoke after Midnight the night before surgery.  Take ONLY these medicines the morning of surgery with A SIP OF WATER: Levothyroxine (Synthroid), Metoprolol and Gabapentin if needed. You may use your [place] eye drops if needed.   If You have been diagnosed with Sleep Apnea - Bring CPAP mask and tubing day of surgery. We will provide you with a CPAP machine on the day of your surgery.                   You may not have any metal on your body including hair pins, jewelry, and body piercing  Do not wear make-up, lotions, powders, perfumesor deodorant  Do not wear nail polish including gel and S&S, artificial / acrylic nails, or any other type of covering on natural nails including finger and toenails. If you have artificial nails, gel coating, etc., that needs to be removed by a nail salon, Please have this removed prior to surgery. Not doing so may mean that your surgery could be cancelled or delayed if the Surgeon or anesthesia staff feels like they are unable to monitor you safely.   Do not shave 48 hours prior to surgery to avoid nicks in your skin which may contribute to postoperative infections.    Contacts, Hearing Aids, dentures or bridgework may not be worn into surgery. DENTURES WILL BE REMOVED PRIOR TO SURGERY PLEASE DO NOT  APPLY "Poly grip" OR ADHESIVES!!!  Patients discharged on the day of surgery will not be allowed to drive home.  Someone NEEDS to stay with you for the first 24 hours after anesthesia.  Do not bring your home medications to the hospital. The Pharmacy will dispense medications listed on your medication list to you during your admission in the Hospital.  Special Instructions: Bring a copy of your healthcare power of attorney and living will documents the day of surgery, if you wish to have them scanned into your [facility] Records- EPIC  Please read over the following fact sheets you were given: IF YOU HAVE QUESTIONS ABOUT YOUR [place], [place] [phone].   [facility] - Preparing for Surgery Before surgery, you can play an important role.  Because skin is not sterile, your skin needs to be as free of germs as possible.  You can reduce the number of germs on your skin by washing with CHG (chlorahexidine gluconate) soap before surgery.  CHG is an antiseptic cleaner which kills germs and bonds with the skin to continue killing germs even after washing. Please DO NOT use if you have an allergy to CHG or antibacterial soaps.  If your skin becomes reddened/irritated stop using the CHG and inform your nurse when you arrive at Short Stay. Do not shave (including legs and underarms) for at least 48 hours prior to the first CHG shower.  You may shave your face/neck.  Please follow these instructions carefully:  1.  Shower with CHG Soap the night before surgery and the  morning of surgery.  2.  If you choose to wash your hair, wash your hair first as usual with your normal  shampoo.  3.  After you shampoo, rinse your hair and body thoroughly to remove the shampoo.  4.  Use CHG as you would any other liquid soap.  You can apply chg directly to the skin and wash.  Gently with a scrungie or clean washcloth.  5.  Apply the CHG Soap to your body ONLY FROM THE  NECK DOWN.   Do not use on face/ open                           Wound or open sores. Avoid contact with eyes, ears mouth and genitals (private parts).                       Wash face,  Genitals (private parts) with your normal soap.             6.  Wash thoroughly, paying special attention to the area where your  surgery  will be performed.  7.  Thoroughly rinse your body with warm water from the neck down.  8.  DO NOT shower/wash with your normal soap after using and rinsing off the CHG Soap.            9.  Pat yourself dry with a clean towel.            10.  Wear clean pajamas.            11.  Place clean sheets on your bed the night of your first shower and do not  sleep with pets.  ON THE DAY OF SURGERY : Do not apply any lotions/deodorants the morning of surgery.  Please wear clean clothes to the hospital/surgery center.    FAILURE TO FOLLOW THESE INSTRUCTIONS MAY RESULT IN THE CANCELLATION OF YOUR SURGERY  PATIENT SIGNATURE_________________________________  NURSE SIGNATURE__________________________________  ________________________________________________________________________

## 2022-07-30 NOTE — Progress Notes (Signed)
COVID Vaccine received:  []  No [x]  Yes Date of any COVID positive Test in last 90 days:  PCP - Lennie Odor, PA Cardiologist - Filiberto Pinks, MD Endocrinology -  Chest x-ray -  EKG -  09-26-2021  Stress Test -  ECHO -  Cardiac Cath -  Long term Monitor- 10-13-2021  epic  PCR screen: []  Ordered & Completed                      []   No Order but Needs PROFEND                      [x]   N/A for this surgery  Surgery Plan:  [x]  Ambulatory                            []  Outpatient in bed                            []  Admit  Anesthesia:    [x]  General  []  Spinal                           []   Choice []   MAC  Pacemaker / ICD device [x]  No []  Yes        Device order form faxed [x]  No    []   Yes      Faxed to:  Spinal Cord Stimulator:[x]  No []  Yes      (Remind patient to bring remote DOS) Other Implants:   History of Sleep Apnea? []  No [x]  Yes   CPAP used?- []  No []  Yes    Does the patient monitor blood sugar? []  No []  Yes  []  N/A    Diet only  Patient has: [x]  Pre-DM   []  DM1  []   DM2 Does patient have a [employer] or Dexacom? []  No []  Yes   Fasting Blood Sugar Ranges-  Checks Blood Sugar _____ times a day  Blood Thinner / Instructions:none Aspirin Instructions: none  ERAS Protocol Ordered: []  No  [x]  Yes PRE-SURGERY []  ENSURE  []  G2  [x]  No Drink Ordered Patient is to be NPO after: 06:15 am  Comments:   Activity level: Patient is able / unable to climb a flight of stairs without difficulty; []  No CP  []  No SOB, but would have ___   Patient can / can not perform ADLs without assistance.   Anesthesia review: HTN, OSA-    , Pre-DM, NASH, anxiety, PONV,  PALPs, Heart murmur as teenager, Left anterior fasicular block, Bradycardia.   Patient denies shortness of breath, fever, cough and chest pain at PAT appointment.  Patient verbalized understanding and agreement to the Pre-Surgical Instructions that were given to them at this PAT appointment. Patient was also  educated of the need to review these PAT instructions again prior to her surgery.I reviewed the appropriate phone numbers to call if they have any and questions or concerns.

## 2022-08-01 ENCOUNTER — Encounter (HOSPITAL_COMMUNITY)
Admission: RE | Admit: 2022-08-01 | Discharge: 2022-08-01 | Disposition: A | Discharge status: Home / Self Care | Payer: BC Managed Care – PPO | Source: Ambulatory Visit | Attending: Surgery | Admitting: Surgery

## 2022-08-01 ENCOUNTER — Other Ambulatory Visit: Payer: Self-pay

## 2022-08-01 ENCOUNTER — Encounter (HOSPITAL_COMMUNITY): Payer: Self-pay | Admitting: *Deleted

## 2022-08-01 VITALS — BP 127/78 | HR 80 | Temp 98.5°F | Resp 18 | Ht 65.0 in | Wt 245.0 lb

## 2022-08-01 DIAGNOSIS — R7303 Prediabetes: Secondary | ICD-10-CM | POA: Insufficient documentation

## 2022-08-01 DIAGNOSIS — Z01812 Encounter for preprocedural laboratory examination: Secondary | ICD-10-CM | POA: Insufficient documentation

## 2022-08-01 DIAGNOSIS — E213 Hyperparathyroidism, unspecified: Secondary | ICD-10-CM | POA: Diagnosis not present

## 2022-08-01 DIAGNOSIS — K760 Fatty (change of) liver, not elsewhere classified: Secondary | ICD-10-CM | POA: Insufficient documentation

## 2022-08-01 DIAGNOSIS — Z6841 Body Mass Index (BMI) 40.0 and over, adult: Secondary | ICD-10-CM | POA: Diagnosis not present

## 2022-08-01 DIAGNOSIS — Z87891 Personal history of nicotine dependence: Secondary | ICD-10-CM | POA: Insufficient documentation

## 2022-08-01 DIAGNOSIS — I10 Essential (primary) hypertension: Secondary | ICD-10-CM | POA: Diagnosis not present

## 2022-08-01 DIAGNOSIS — D351 Benign neoplasm of parathyroid gland: Secondary | ICD-10-CM | POA: Diagnosis not present

## 2022-08-01 DIAGNOSIS — Z79890 Hormone replacement therapy (postmenopausal): Secondary | ICD-10-CM | POA: Diagnosis not present

## 2022-08-01 DIAGNOSIS — G4730 Sleep apnea, unspecified: Secondary | ICD-10-CM | POA: Insufficient documentation

## 2022-08-01 DIAGNOSIS — Z01818 Encounter for other preprocedural examination: Secondary | ICD-10-CM

## 2022-08-01 DIAGNOSIS — E039 Hypothyroidism, unspecified: Secondary | ICD-10-CM | POA: Diagnosis not present

## 2022-08-01 DIAGNOSIS — E210 Primary hyperparathyroidism: Secondary | ICD-10-CM | POA: Insufficient documentation

## 2022-08-01 DIAGNOSIS — Z79899 Other long term (current) drug therapy: Secondary | ICD-10-CM | POA: Diagnosis not present

## 2022-08-01 LAB — CBC
HCT: 46.1 % — ABNORMAL HIGH (ref 36.0–46.0)
Hemoglobin: 15 g/dL (ref 12.0–15.0)
MCH: 29.1 pg (ref 26.0–34.0)
MCHC: 32.5 g/dL (ref 30.0–36.0)
MCV: 89.3 fL (ref 80.0–100.0)
Platelets: 251 10*3/uL (ref 150–400)
RBC: 5.16 MIL/uL — ABNORMAL HIGH (ref 3.87–5.11)
RDW: 12.7 % (ref 11.5–15.5)
WBC: 7.8 10*3/uL (ref 4.0–10.5)
nRBC: 0 % (ref 0.0–0.2)

## 2022-08-01 LAB — COMPREHENSIVE METABOLIC PANEL
ALT: 32 U/L (ref 0–44)
AST: 23 U/L (ref 15–41)
Albumin: 4.3 g/dL (ref 3.5–5.0)
Alkaline Phosphatase: 79 U/L (ref 38–126)
Anion gap: 6 (ref 5–15)
BUN: 20 mg/dL (ref 6–20)
CO2: 27 mmol/L (ref 22–32)
Calcium: 10.6 mg/dL — ABNORMAL HIGH (ref 8.9–10.3)
Chloride: 107 mmol/L (ref 98–111)
Creatinine, Ser: 0.61 mg/dL (ref 0.44–1.00)
GFR, Estimated: >60 mL/min (ref >60)
Glucose, Bld: 98 mg/dL (ref 70–99)
Potassium: 4.6 mmol/L (ref 3.5–5.1)
Sodium: 140 mmol/L (ref 135–145)
Total Bilirubin: 0.7 mg/dL (ref 0.3–1.2)
Total Protein: 6.9 g/dL (ref 6.5–8.1)

## 2022-08-01 LAB — SURGICAL PCR SCREEN
MRSA, PCR: NEGATIVE
Staphylococcus aureus: NEGATIVE

## 2022-08-02 LAB — HEMOGLOBIN A1C
Hgb A1c MFr Bld: 5.8 % — ABNORMAL HIGH (ref 4.8–5.6)
Mean Plasma Glucose: 120 mg/dL

## 2022-08-02 NOTE — Progress Notes (Signed)
Anesthesia Chart Review   Case: Dana Liu Date/Time: 08/03/22 0915   Procedure: RIGHT INFERIOR PARATHYROIDECTOMY (Right)   Anesthesia type: General   Pre-op diagnosis: PRIMARY HYPERPARATHYROIDISM   Location: [facility] ROOM 04 / [facility]   Surgeons: Armandina Gemma, MD       DISCUSSION:57 y.o. former smoker with h/o PONV, HTN, sleep apnea, primary hyperparathyroidism scheduled for above procedure 08/03/22 with Dr. Armandina Gemma.   Pt seen by cardiology 03/21/2022 for preoperative evaluation.  Per OV note, "low risk from a cardiac perspective"  Anticipate pt can proceed with planned procedure barring acute status change.   VS: BP 127/78 Comment: right arm sitting  Pulse 80   Temp 36.9 C (Oral)   Resp 18   Ht 5\' 5"  (1.651 m)   Wt 111.1 kg   LMP 11/28/2020   SpO2 98%   BMI 40.77 kg/m   PROVIDERS: Lennie Odor, PA is PCP   Werner Lean, MD is cardiologist  LABS: Labs reviewed: Acceptable for surgery. (all labs ordered are listed, but only abnormal results are displayed)  Labs Reviewed  COMPREHENSIVE METABOLIC PANEL - Abnormal; Notable for the following components:      Result Value   Calcium 10.6 (*)    All other components within normal limits  HEMOGLOBIN A1C - Abnormal; Notable for the following components:   Hgb A1c MFr Bld 5.8 (*)    All other components within normal limits  CBC - Abnormal; Notable for the following components:   RBC 5.16 (*)    HCT 46.1 (*)    All other components within normal limits  SURGICAL PCR SCREEN     IMAGES:   EKG:   CV:  Past Medical History:  Diagnosis Date   Anxiety    Arthritis    left knee   Back pain    Depression    Fatty liver    Heart murmur    as teenager has resolved   HPV in female    Hypertension    postpartum   Hypothyroidism    IBS (irritable bowel syndrome)    Joint pain    Morbid obesity (HCC)    Osteoarthritis    Palpitations    Pneumonia    PONV (postoperative nausea and vomiting)    Sleep  apnea    Vitamin D deficiency     Past Surgical History:  Procedure Laterality Date   APPENDECTOMY     1996   COLPOSCOPY  04/08/2006   HYSTEROSCOPY W/ ENDOMETRIAL ABLATION  2016   IUD REMOVAL N/A 05/11/2016   Procedure: INTRAUTERINE DEVICE (IUD) REMOVAL;  Surgeon: Crawford Givens, MD;  Location: [facility];  Service: Gynecology;  Laterality: N/A;   JOINT REPLACEMENT Left 03/2022   KNEE ARTHROSCOPY W/ MENISCECTOMY Left    LAPAROSCOPIC OVARIAN CYSTECTOMY Left 05/11/2016   Procedure: LAPAROSCOPIC OVARIAN CYSTECTOMY ,DRAINAGE LEFT OVARIAN CYST;  Surgeon: Crawford Givens, MD;  Location: [facility];  Service: Gynecology;  Laterality: Left;   WISDOM TOOTH EXTRACTION      MEDICATIONS:  gabapentin (NEURONTIN) 300 MG capsule   ibuprofen (ADVIL) 200 MG tablet   ketotifen (ALAWAY) 0.035 % ophthalmic solution   levothyroxine (SYNTHROID, LEVOTHROID) 50 MCG tablet   losartan (COZAAR) 100 MG tablet   metoprolol succinate (TOPROL-XL) 50 MG 24 hr tablet   Multiple Vitamin (MULTIVITAMIN) capsule   Probiotic Product (PROBIOTIC PO)   Turmeric (QC TUMERIC COMPLEX PO)   No current facility-administered medications for this encounter.     Dana Felix Ward, PA-C [facility]  Testing [phone]

## 2022-08-03 ENCOUNTER — Ambulatory Visit (HOSPITAL_COMMUNITY): Payer: BC Managed Care – PPO | Admitting: Physician Assistant

## 2022-08-03 ENCOUNTER — Ambulatory Visit (HOSPITAL_COMMUNITY)
Admission: RE | Admit: 2022-08-03 | Discharge: 2022-08-03 | Disposition: A | Discharge status: Home / Self Care | Payer: BC Managed Care – PPO | Source: Ambulatory Visit | Attending: Surgery | Admitting: Surgery

## 2022-08-03 ENCOUNTER — Encounter (HOSPITAL_COMMUNITY)
Admission: RE | Disposition: A | Discharge status: Home / Self Care | Payer: Self-pay | Source: Ambulatory Visit | Attending: Surgery

## 2022-08-03 ENCOUNTER — Encounter (HOSPITAL_COMMUNITY): Payer: Self-pay | Admitting: Surgery

## 2022-08-03 ENCOUNTER — Ambulatory Visit (HOSPITAL_COMMUNITY): Payer: BC Managed Care – PPO | Admitting: Anesthesiology

## 2022-08-03 ENCOUNTER — Other Ambulatory Visit: Payer: Self-pay

## 2022-08-03 DIAGNOSIS — Z79899 Other long term (current) drug therapy: Secondary | ICD-10-CM | POA: Insufficient documentation

## 2022-08-03 DIAGNOSIS — E213 Hyperparathyroidism, unspecified: Secondary | ICD-10-CM | POA: Insufficient documentation

## 2022-08-03 DIAGNOSIS — K760 Fatty (change of) liver, not elsewhere classified: Secondary | ICD-10-CM

## 2022-08-03 DIAGNOSIS — Z87891 Personal history of nicotine dependence: Secondary | ICD-10-CM | POA: Insufficient documentation

## 2022-08-03 DIAGNOSIS — I10 Essential (primary) hypertension: Secondary | ICD-10-CM

## 2022-08-03 DIAGNOSIS — E210 Primary hyperparathyroidism: Secondary | ICD-10-CM | POA: Diagnosis present

## 2022-08-03 DIAGNOSIS — Z79890 Hormone replacement therapy (postmenopausal): Secondary | ICD-10-CM | POA: Insufficient documentation

## 2022-08-03 DIAGNOSIS — G4730 Sleep apnea, unspecified: Secondary | ICD-10-CM | POA: Insufficient documentation

## 2022-08-03 DIAGNOSIS — R7303 Prediabetes: Secondary | ICD-10-CM

## 2022-08-03 DIAGNOSIS — Z6841 Body Mass Index (BMI) 40.0 and over, adult: Secondary | ICD-10-CM | POA: Insufficient documentation

## 2022-08-03 DIAGNOSIS — D351 Benign neoplasm of parathyroid gland: Secondary | ICD-10-CM | POA: Insufficient documentation

## 2022-08-03 DIAGNOSIS — E039 Hypothyroidism, unspecified: Secondary | ICD-10-CM | POA: Insufficient documentation

## 2022-08-03 HISTORY — PX: PARATHYROIDECTOMY: SHX19

## 2022-08-03 LAB — GLUCOSE, CAPILLARY: Glucose-Capillary: 94 mg/dL (ref 70–99)

## 2022-08-03 SURGERY — PARATHYROIDECTOMY
Anesthesia: General | Laterality: Right

## 2022-08-03 MED ORDER — AMISULPRIDE (ANTIEMETIC) 5 MG/2ML IV SOLN: 10.0000 mg | Freq: Once | INTRAVENOUS | Status: DC | PRN

## 2022-08-03 MED ORDER — EPHEDRINE SULFATE (PRESSORS) 50 MG/ML IJ SOLN
INTRAMUSCULAR | Status: DC | PRN
  Administered 2022-08-03: 5 mg via INTRAVENOUS
  Administered 2022-08-03: 7 mg via INTRAVENOUS

## 2022-08-03 MED ORDER — ORAL CARE MOUTH RINSE: 15.0000 mL | Freq: Once | OROMUCOSAL | Status: AC

## 2022-08-03 MED ORDER — FENTANYL CITRATE (PF) 250 MCG/5ML IJ SOLN
INTRAMUSCULAR | Status: AC
  Filled 2022-08-03: qty 5

## 2022-08-03 MED ORDER — FENTANYL CITRATE PF 50 MCG/ML IJ SOSY
PREFILLED_SYRINGE | INTRAMUSCULAR | Status: AC
  Administered 2022-08-03: 12.5 ug via INTRAVENOUS
  Filled 2022-08-03: qty 2

## 2022-08-03 MED ORDER — CHLORHEXIDINE GLUCONATE CLOTH 2 % EX PADS: 6.0000 | MEDICATED_PAD | Freq: Once | CUTANEOUS | Status: DC

## 2022-08-03 MED ORDER — ACETAMINOPHEN 500 MG PO TABS
1000.0000 mg | ORAL_TABLET | Freq: Once | ORAL | Status: AC
  Administered 2022-08-03: 1000 mg via ORAL
  Filled 2022-08-03: qty 2

## 2022-08-03 MED ORDER — CEFAZOLIN SODIUM-DEXTROSE 2-4 GM/100ML-% IV SOLN
2.0000 g | INTRAVENOUS | Status: AC
  Administered 2022-08-03: 2 g via INTRAVENOUS
  Filled 2022-08-03: qty 100

## 2022-08-03 MED ORDER — 0.9 % SODIUM CHLORIDE (POUR BTL) OPTIME
TOPICAL | Status: DC | PRN
  Administered 2022-08-03: 1000 mL

## 2022-08-03 MED ORDER — MIDAZOLAM HCL 5 MG/5ML IJ SOLN
INTRAMUSCULAR | Status: DC | PRN
  Administered 2022-08-03 (×2): 1 mg via INTRAVENOUS

## 2022-08-03 MED ORDER — ONDANSETRON HCL 4 MG/2ML IJ SOLN
INTRAMUSCULAR | Status: AC
  Filled 2022-08-03: qty 2

## 2022-08-03 MED ORDER — PROPOFOL 10 MG/ML IV BOLUS
INTRAVENOUS | Status: AC
  Filled 2022-08-03: qty 20

## 2022-08-03 MED ORDER — BUPIVACAINE HCL 0.25 % IJ SOLN
INTRAMUSCULAR | Status: AC
  Filled 2022-08-03: qty 1

## 2022-08-03 MED ORDER — DEXAMETHASONE SODIUM PHOSPHATE 10 MG/ML IJ SOLN
INTRAMUSCULAR | Status: AC
  Filled 2022-08-03: qty 1

## 2022-08-03 MED ORDER — DEXMEDETOMIDINE HCL IN NACL 80 MCG/20ML IV SOLN
INTRAVENOUS | Status: DC | PRN
  Administered 2022-08-03: 8 ug via BUCCAL

## 2022-08-03 MED ORDER — DEXAMETHASONE SODIUM PHOSPHATE 10 MG/ML IJ SOLN
INTRAMUSCULAR | Status: DC | PRN
  Administered 2022-08-03: 8 mg via INTRAVENOUS

## 2022-08-03 MED ORDER — ROCURONIUM BROMIDE 100 MG/10ML IV SOLN
INTRAVENOUS | Status: DC | PRN
  Administered 2022-08-03: 10 mg via INTRAVENOUS
  Administered 2022-08-03: 60 mg via INTRAVENOUS

## 2022-08-03 MED ORDER — MIDAZOLAM HCL 2 MG/2ML IJ SOLN
INTRAMUSCULAR | Status: AC
  Filled 2022-08-03: qty 2

## 2022-08-03 MED ORDER — ROCURONIUM BROMIDE 10 MG/ML (PF) SYRINGE
PREFILLED_SYRINGE | INTRAVENOUS | Status: AC
  Filled 2022-08-03: qty 10

## 2022-08-03 MED ORDER — EPHEDRINE 5 MG/ML INJ
INTRAVENOUS | Status: AC
  Filled 2022-08-03: qty 5

## 2022-08-03 MED ORDER — ONDANSETRON HCL 4 MG/2ML IJ SOLN
INTRAMUSCULAR | Status: DC | PRN
  Administered 2022-08-03: 4 mg via INTRAVENOUS

## 2022-08-03 MED ORDER — CHLORHEXIDINE GLUCONATE 0.12 % MT SOLN
15.0000 mL | Freq: Once | OROMUCOSAL | Status: AC
  Administered 2022-08-03: 15 mL via OROMUCOSAL

## 2022-08-03 MED ORDER — BUPIVACAINE HCL 0.25 % IJ SOLN
INTRAMUSCULAR | Status: DC | PRN
  Administered 2022-08-03: 10 mL

## 2022-08-03 MED ORDER — LACTATED RINGERS IV SOLN: INTRAVENOUS | Status: DC

## 2022-08-03 MED ORDER — FENTANYL CITRATE (PF) 100 MCG/2ML IJ SOLN
INTRAMUSCULAR | Status: DC | PRN
  Administered 2022-08-03: 25 ug via INTRAVENOUS
  Administered 2022-08-03: 50 ug via INTRAVENOUS
  Administered 2022-08-03: 100 ug via INTRAVENOUS
  Administered 2022-08-03: 25 ug via INTRAVENOUS
  Administered 2022-08-03: 50 ug via INTRAVENOUS

## 2022-08-03 MED ORDER — FENTANYL CITRATE PF 50 MCG/ML IJ SOSY
25.0000 ug | PREFILLED_SYRINGE | INTRAMUSCULAR | Status: DC | PRN
  Administered 2022-08-03 (×2): 25 ug via INTRAVENOUS
  Administered 2022-08-03: 12.5 ug via INTRAVENOUS
  Administered 2022-08-03: 25 ug via INTRAVENOUS

## 2022-08-03 MED ORDER — LIDOCAINE HCL (PF) 2 % IJ SOLN
INTRAMUSCULAR | Status: AC
  Filled 2022-08-03: qty 5

## 2022-08-03 MED ORDER — DEXMEDETOMIDINE HCL IN NACL 80 MCG/20ML IV SOLN
INTRAVENOUS | Status: AC
  Filled 2022-08-03: qty 20

## 2022-08-03 MED ORDER — LIDOCAINE HCL (CARDIAC) PF 100 MG/5ML IV SOSY
PREFILLED_SYRINGE | INTRAVENOUS | Status: DC | PRN
  Administered 2022-08-03: 60 mg via INTRAVENOUS

## 2022-08-03 MED ORDER — HEMOSTATIC AGENTS (NO CHARGE) OPTIME
TOPICAL | Status: DC | PRN
  Administered 2022-08-03: 1 via TOPICAL

## 2022-08-03 MED ORDER — PROPOFOL 10 MG/ML IV BOLUS
INTRAVENOUS | Status: DC | PRN
  Administered 2022-08-03: 200 mg via INTRAVENOUS

## 2022-08-03 MED ORDER — TRAMADOL HCL 50 MG PO TABS: 50.0000 mg | ORAL_TABLET | Freq: Four times a day (QID) | ORAL | 0 refills | Status: DC | PRN

## 2022-08-03 MED ORDER — SUGAMMADEX SODIUM 200 MG/2ML IV SOLN
INTRAVENOUS | Status: DC | PRN
  Administered 2022-08-03: 200 mg via INTRAVENOUS

## 2022-08-03 SURGICAL SUPPLY — 35 items

## 2022-08-03 NOTE — Anesthesia Preprocedure Evaluation (Signed)
Anesthesia Evaluation  Patient identified by MRN, date of birth, ID band Patient awake    Reviewed: Allergy & Precautions, NPO status , Patient's Chart, lab work & pertinent test results  History of Anesthesia Complications (+) PONV and history of anesthetic complications  Airway Mallampati: II  TM Distance: >3 FB Neck ROM: Full    Dental  (+) Dental Advisory Given   Pulmonary sleep apnea , former smoker   breath sounds clear to auscultation       Cardiovascular hypertension, Pt. on medications  Rhythm:Regular Rate:Normal     Neuro/Psych negative neurological ROS     GI/Hepatic Neg liver ROS,GERD  ,,  Endo/Other  Hypothyroidism  Morbid obesity  Renal/GU negative Renal ROS     Musculoskeletal  (+) Arthritis ,    Abdominal   Peds  Hematology negative hematology ROS (+)   Anesthesia Other Findings   Reproductive/Obstetrics                             Anesthesia Physical Anesthesia Plan  ASA: 3  Anesthesia Plan: General   Post-op Pain Management: Tylenol PO (pre-op)*   Induction: Intravenous  PONV Risk Score and Plan: 4 or greater and Scopolamine patch - Pre-op, Midazolam, Dexamethasone, Ondansetron and Treatment may vary due to age or medical condition  Airway Management Planned: Oral ETT  Additional Equipment:   Intra-op Plan:   Post-operative Plan: Extubation in OR  Informed Consent: I have reviewed the patients History and Physical, chart, labs and discussed the procedure including the risks, benefits and alternatives for the proposed anesthesia with the patient or authorized representative who has indicated his/her understanding and acceptance.       Plan Discussed with: CRNA  Anesthesia Plan Comments:        Anesthesia Quick Evaluation

## 2022-08-03 NOTE — Anesthesia Procedure Notes (Signed)
Procedure Name: Intubation Date/Time: 08/03/2022 9:22 AM  Performed by: Garrel Ridgel, CRNAPre-anesthesia Checklist: Patient identified, Emergency Drugs available, Suction available and Patient being monitored Patient Re-evaluated:Patient Re-evaluated prior to induction Oxygen Delivery Method: Circle system utilized Preoxygenation: Pre-oxygenation with 100% oxygen Induction Type: IV induction Ventilation: Mask ventilation without difficulty Laryngoscope Size: Mac and 3 Grade View: Grade I Tube type: Oral Tube size: 7.0 mm Number of attempts: 1 Airway Equipment and Method: Stylet Placement Confirmation: ETT inserted through vocal cords under direct vision, positive ETCO2 and breath sounds checked- equal and bilateral Secured at: 22 cm Tube secured with: Tape Dental Injury: Teeth and Oropharynx as per pre-operative assessment

## 2022-08-03 NOTE — Interval H&P Note (Signed)
History and Physical Interval Note:  08/03/2022 8:44 AM  Dana Liu  has presented today for surgery, with the diagnosis of PRIMARY HYPERPARATHYROIDISM.  The various methods of treatment have been discussed with the patient and family. After consideration of risks, benefits and other options for treatment, the patient has consented to    Procedure(s): RIGHT INFERIOR PARATHYROIDECTOMY (Right) as a surgical intervention.    The patient's history has been reviewed, patient examined, no change in status, stable for surgery.  I have reviewed the patient's chart and labs.  Questions were answered to the patient's satisfaction.    Armandina Gemma, [facility] [place] practice Office: [place]

## 2022-08-03 NOTE — Transfer of Care (Signed)
Immediate Anesthesia Transfer of Care Note  Patient: Dana Liu  Procedure(s) Performed: RIGHT INFERIOR PARATHYROIDECTOMY (Right)  Patient Location: PACU  Anesthesia Type:General  Level of Consciousness: awake, alert , oriented, and patient cooperative  Airway & Oxygen Therapy: Patient Spontanous Breathing and Patient connected to face mask oxygen  Post-op Assessment: Report given to RN and Post -op Vital signs reviewed and stable  Post vital signs: Reviewed and stable  Last Vitals:  Vitals Value Taken Time  BP 162/85 08/03/22 1031  Temp    Pulse 84 08/03/22 1033  Resp 14 08/03/22 1033  SpO2 98 % 08/03/22 1033  Vitals shown include unvalidated device data.  Last Pain:  Vitals:   08/03/22 0803  TempSrc:   PainSc: 3       Patients Stated Pain Goal: 0 ([id number])  Complications: No notable events documented.

## 2022-08-03 NOTE — Op Note (Signed)
OPERATIVE REPORT - PARATHYROIDECTOMY  Preoperative diagnosis: Primary hyperparathyroidism  Postop diagnosis: Same  Procedure: Right inferior minimally invasive parathyroidectomy  Surgeon:  Armandina Gemma, MD  Anesthesia: General endotracheal  Estimated blood loss: Minimal  Preparation: ChloraPrep  Indications: Patient is referred by Dr. Alanson Aly for surgical evaluation and management of newly diagnosed primary hyperparathyroidism. Patient was first noted on routine laboratory studies to have an elevated serum calcium level in October 2022. Recent laboratory studies show a calcium level as high as 11.4 and an unsuppressed intact PTH level of 41. 25-hydroxy vitamin D level was normal at 35.2. Patient underwent a nuclear medicine parathyroid scan on March 20, 2022. This localized a right inferior parathyroid adenoma. USN confirmed a right inferior parathyroid adenoma.  Patient now comes to surgery for parathyroidectomy.  Procedure: The patient was prepared in the pre-operative holding area. The patient was brought to the operating room and placed in a supine position on the operating room table. Following administration of general anesthesia, the patient was positioned and then prepped and draped in the usual strict aseptic fashion. After ascertaining that an adequate level of anesthesia been achieved, a neck incision was made with a #15 blade. Dissection was carried through subcutaneous tissues and platysma. Hemostasis was obtained with the electrocautery. Skin flaps were developed circumferentially and a Weitlander retractor was placed for exposure.  Strap muscles were incised in the midline. Strap muscles were reflected laterally exposing the thyroid lobe. With gentle blunt dissection the thyroid lobe was mobilized.  Dissection was carried posteriorly and an enlarged parathyroid gland was identified. It was gently mobilized. Vascular structures were divided between small ligaclips. Care was  taken to avoid the recurrent laryngeal nerve. The parathyroid gland was completely excised. It was submitted to pathology where frozen section confirmed hypercellular parathyroid tissue consistent with adenoma.  Neck was irrigated with warm saline and good hemostasis was noted. Fibrillar was placed in the operative field. Strap muscles were approximated in the midline with interrupted 3-0 Vicryl sutures. Platysma was closed with interrupted 3-0 Vicryl sutures. Marcaine was infiltrated circumferentially. Skin was closed with a running 4-0 Monocryl subcuticular suture. Wound was washed and dried and Dermabond was applied. Patient was awakened from anesthesia and brought to the recovery room. The patient tolerated the procedure well.   Armandina Gemma, [facility] Office: [phone]

## 2022-08-03 NOTE — Anesthesia Postprocedure Evaluation (Signed)
Anesthesia Post Note  Patient: Aleda Carmenate  Procedure(s) Performed: RIGHT INFERIOR PARATHYROIDECTOMY (Right)     Patient location during evaluation: PACU Anesthesia Type: General Level of consciousness: awake and alert Pain management: pain level controlled Vital Signs Assessment: post-procedure vital signs reviewed and stable Respiratory status: spontaneous breathing, nonlabored ventilation, respiratory function stable and patient connected to nasal cannula oxygen Cardiovascular status: blood pressure returned to baseline and stable Postop Assessment: no apparent nausea or vomiting Anesthetic complications: no  No notable events documented.  Last Vitals:  Vitals:   08/03/22 1145 08/03/22 1200  BP: (!) 158/99   Pulse: 75 71  Resp: 20   Temp: (!) 36.4 C   SpO2: 92% 92%    Last Pain:  Vitals:   08/03/22 1145  TempSrc:   PainSc: 0-No pain                 Tiajuana Amass

## 2022-08-03 NOTE — Discharge Instructions (Addendum)
[facility] - Dr. Todd Gerkin  THYROID & PARATHYROID SURGERY:  POST-OP INSTRUCTIONS  Always review the instruction sheet provided by the hospital nurse at discharge.  A prescription for pain medication may be sent to your pharmacy at the time of discharge.  Take your pain medication as prescribed.  If narcotic pain medicine is not needed, then you may take acetaminophen (Tylenol) or ibuprofen (Advil) as needed for pain or soreness.  Take your normal home medications as prescribed unless otherwise directed.  If you need a refill on your pain medication, please contact the office during regular business hours.  Prescriptions will not be processed by the office after 5:00PM or on weekends.  Start with a light diet upon arrival home, such as soup and crackers or toast.  Be sure to drink plenty of fluids.  Resume your normal diet the day after surgery.  Most patients will experience some swelling and bruising on the chest and neck area.  Ice packs will help for the first 48 hours after arriving home.  Swelling and bruising will take several days to resolve.   It is common to experience some constipation after surgery.  Increasing fluid intake and taking a stool softener (Colace) will usually help to prevent this problem.  A mild laxative (Milk of Magnesia or Miralax) should be taken according to package directions if there has been no bowel movement after 48 hours.  Dermabond glue covers your incision. This seals the wound and you may shower at any time. The Dermabond will remain in place for about a week.  You may gradually remove the glue when it loosens around the edges.  If you need to loosen the Dermabond for removal, apply a layer of Vaseline to the wound for 15 minutes and then remove with a Kleenex. Your sutures are under the skin and will not show - they will dissolve on their own.  You may resume light daily activities beginning the day after discharge (such as self-care,  walking, climbing stairs), gradually increasing activities as tolerated. You may have sexual intercourse when it is comfortable. Refrain from any heavy lifting or straining until approved by your doctor. You may drive when you no longer are taking prescription pain medication, you can comfortably wear a seatbelt, and you can safely maneuver your car and apply the brakes.  You will see your doctor in the office for a follow-up appointment approximately three weeks after your surgery.  Make sure that you call for this appointment within a day or two after you arrive home to insure a convenient appointment time. Please have any requested laboratory tests performed a few days prior to your office visit so that the results will be available at your follow up appointment.  WHEN TO CALL THE [facility] OFFICE: -- Fever greater than 101.5 -- Inability to urinate -- Nausea and/or vomiting - persistent -- Extreme swelling or bruising -- Continued bleeding from incision -- Increased pain, redness, or drainage from the incision -- Difficulty swallowing or breathing -- Muscle cramping or spasms -- Numbness or tingling in hands or around lips  The clinic staff is available to answer your questions during regular business hours.  Please don't hesitate to call and ask to speak to one of the nurses if you have concerns.  [facility] OFFICE: [phone] (24 hours)  Please sign up for MyChart accounts. This will allow you to communicate directly with my nurse or myself without having to call the office. It will also allow you   to view your test results. You will need to enroll in MyChart for my office ([facility]) and for the hospital ([facility]).  Todd Gerkin, MD [facility] practice 

## 2022-08-04 ENCOUNTER — Encounter (HOSPITAL_COMMUNITY): Payer: Self-pay | Admitting: Surgery

## 2022-08-06 LAB — SURGICAL PATHOLOGY

## 2022-08-29 ENCOUNTER — Encounter (INDEPENDENT_AMBULATORY_CARE_PROVIDER_SITE_OTHER): Payer: Self-pay

## 2022-09-03 ENCOUNTER — Ambulatory Visit (INDEPENDENT_AMBULATORY_CARE_PROVIDER_SITE_OTHER): Payer: BC Managed Care – PPO | Admitting: Family Medicine

## 2022-09-17 ENCOUNTER — Ambulatory Visit (INDEPENDENT_AMBULATORY_CARE_PROVIDER_SITE_OTHER): Payer: BC Managed Care – PPO | Admitting: Family Medicine

## 2022-09-17 ENCOUNTER — Encounter (INDEPENDENT_AMBULATORY_CARE_PROVIDER_SITE_OTHER): Payer: Self-pay | Admitting: Family Medicine

## 2022-09-17 VITALS — BP 113/77 | HR 58 | Temp 98.2°F | Ht 65.0 in | Wt 251.0 lb

## 2022-09-17 DIAGNOSIS — E559 Vitamin D deficiency, unspecified: Secondary | ICD-10-CM

## 2022-09-17 DIAGNOSIS — M1711 Unilateral primary osteoarthritis, right knee: Secondary | ICD-10-CM | POA: Diagnosis not present

## 2022-09-17 DIAGNOSIS — R7303 Prediabetes: Secondary | ICD-10-CM

## 2022-09-17 DIAGNOSIS — E038 Other specified hypothyroidism: Secondary | ICD-10-CM | POA: Diagnosis not present

## 2022-09-17 DIAGNOSIS — Z6841 Body Mass Index (BMI) 40.0 and over, adult: Secondary | ICD-10-CM

## 2022-09-17 NOTE — Progress Notes (Unsigned)
   Office: [phone]  /  Fax: [phone]  WEIGHT SUMMARY AND BIOMETRICS  Starting Date: 09/12/21  Starting Weight: 260lb   Weight Lost Since Last Visit: 0   Vitals Temp: 98.2 F (36.8 C) BP: 113/77 Pulse Rate: (!) 58 SpO2: 95 %   Body Composition  Body Fat %: 50.5 % Fat Mass (lbs): 126.8 lbs Muscle Mass (lbs): 118.2 lbs Total Body Water (lbs): 94.6 lbs Visceral Fat Rating : 16     HPI  Chief Complaint: OBESITY  Dana Liu is here to discuss her progress with her obesity treatment plan. She is on the keeping a food journal and adhering to recommended goals of 1600 calories and 100 protein and states she is following her eating plan approximately 90 % of the time. She states she is exercising 30 minutes 2 times per week.   Interval History:  Since last office visit she is up 5 lb She did have a parathyroidectomy (1) removed for a tumor- non cancerous Surgery done by Dr Gerrit Friends and she is healing up Dr Roanna Raider is managing her thyroid  and she has been running HYPO and feeling tired She is making good food choices Exercise has been limited but she plans to get back into water exercise She is back on vitamin D She has been more tired and is retaining fluids   Pharmacotherapy: none  PHYSICAL EXAM:  Blood pressure 113/77, pulse (!) 58, temperature 98.2 F (36.8 C), height 5\' 5"  (1.651 m), weight 251 lb (113.9 kg), last menstrual period 11/28/2020, SpO2 95 %. Body mass index is 41.77 kg/m.  General: She is overweight, cooperative, alert, well developed, and in no acute distress. PSYCH: Has normal mood, affect and thought process.   Lungs: Normal breathing effort, no conversational dyspnea.   ASSESSMENT AND PLAN  TREATMENT PLAN FOR OBESITY:  Recommended Dietary Goals  [name] is currently in the action stage of change. As such, her goal is to continue weight management plan. She has agreed to keeping a food journal and adhering to recommended goals of ***  calories and *** protein.  Behavioral Intervention  We discussed the following Behavioral Modification Strategies today: {EMWMwtlossstrategies:28914::"increasing lean protein intake","decreasing simple carbohydrates ","increasing vegetables","increasing lower glycemic fruits","increasing water intake","continue to practice mindfulness when eating","planning for success"}.  Additional resources provided today: NA  Recommended Physical Activity Goals  [name] has been advised to work up to 150 minutes of moderate intensity aerobic activity a week and strengthening exercises 2-3 times per week for cardiovascular health, weight loss maintenance and preservation of muscle mass.   She has agreed to {EMEXERCISE:28847}  Pharmacotherapy changes for the treatment of obesity:   ASSOCIATED CONDITIONS ADDRESSED TODAY  There are no diagnoses linked to this encounter.    She was informed of the importance of frequent follow up visits to maximize her success with intensive lifestyle modifications for her multiple health conditions.   ATTESTASTION STATEMENTS:  Reviewed by clinician on day of visit: allergies, medications, problem list, medical history, surgical history, family history, social history, and previous encounter notes pertinent to obesity diagnosis.   I have personally spent 30 minutes total time today in preparation, patient care, nutritional counseling and documentation for this visit, including the following: review of clinical lab tests; review of medical tests/procedures/services.      Godfrey Pick Valera Vallas, CMA DABFM, DABOM [facility] [address] [place], [place] [address] [phone]

## 2022-09-18 DIAGNOSIS — R7303 Prediabetes: Secondary | ICD-10-CM | POA: Insufficient documentation

## 2022-09-18 NOTE — Assessment & Plan Note (Signed)
Lab Results  Component Value Date   HGBA1C 5.8 (H) 08/01/2022   She has actively been working on weight reduction with creation of a caloric deficit, reducing her intake of sugar and starches.  Her exercise has been reduced due to recent surgeries.  She has plans to get back into water exercise soon.  Continue working on a low sugar/low starch diet, regular exercise and weight reduction.  Plan to recheck A1c in July.

## 2022-09-18 NOTE — Assessment & Plan Note (Signed)
She is back on a vitamin D supplement under the care of her endocrinologist.  She is no longer on a calcium supplement.  Her energy level has been low.  She will bring in a copy of her most recent labs including her vitamin D level at her next visit.  Target vitamin D level will be 50-70. Continue vitamin D per endocrinology.

## 2022-09-18 NOTE — Assessment & Plan Note (Signed)
Hypothyroidism managed by Dr. Roanna Raider at [facility] physicians.  She notes that post partial parathyroidectomy, her TSH increased.  Her levothyroxine dose was increased.  She has experienced weight gain and fatigue.  She denies cold intolerance or constipation.  She reports taking her levothyroxine on an empty stomach every day.  Continue current dose of levothyroxine and follow-up with endocrinology as scheduled.  Repeat TSH is scheduled for July.

## 2022-09-18 NOTE — Assessment & Plan Note (Signed)
She is healing well status post right T KR November 2023.  She has completed physical therapy.  She is ambulating better.  She plans to have a right TKR in the next year.  She plans to get back into water exercise, awaiting improved energy levels from increased fatigue with hypothyroidism status post partial parathyroidectomy done 08/03/2022.  Continue to work on home PT exercises.  Plan to get back into water exercise in the next 2 weeks with a goal of 3 days a week.

## 2022-09-28 ENCOUNTER — Ambulatory Visit: Payer: BC Managed Care – PPO | Admitting: Internal Medicine

## 2022-10-23 ENCOUNTER — Ambulatory Visit (INDEPENDENT_AMBULATORY_CARE_PROVIDER_SITE_OTHER): Payer: BC Managed Care – PPO | Admitting: Family Medicine

## 2022-11-05 NOTE — Progress Notes (Unsigned)
  Cardiology Office Note:  .   Date:  11/05/2022  ID:  Dana Liu, DOB 13-Mar-1966, MRN [id number] PCP: Dana Height, PA  [facility] Providers Cardiologist:  Dana Constant, MD {  History of Present Illness: .   Dana Liu is a 57 y.o. female with a past medical history of OSA, HTN, anxiety who presents for follow-up.  Originally seen for evaluation of palpitations 09/26/2021.  Had a negative heart monitor.  She was last seen 03/2022 and the patient noted that she is doing okay at that time.  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  Results had not been formally resulted.  There were no interval hospital/ED visits.  She was participating in healthy weight and wellness and lost 30 pounds.  No chest pain or pressure.  No SOB/DOE.  No PND/orthopnea.  No weight gain or leg swelling.  No palpitations or syncope.  Knee pain was limiting her activity.  Today, she***  ROS: ***  Studies Reviewed: .        *** Risk Assessment/Calculations:   {Does this patient have ATRIAL FIBRILLATION?[phone]} No BP recorded.  {Refresh Note OR Click here to enter BP  :1}***       Physical Exam:   VS:  LMP 11/28/2020    Wt Readings from Last 3 Encounters:  09/17/22 251 lb (113.9 kg)  08/03/22 245 lb (111.1 kg)  08/01/22 245 lb (111.1 kg)    GEN: Well nourished, well developed in no acute distress NECK: No JVD; No carotid bruits CARDIAC: ***RRR, no murmurs, rubs, gallops RESPIRATORY:  Clear to auscultation without rales, wheezing or rhonchi  ABDOMEN: Soft, non-tender, non-distended EXTREMITIES:  No edema; No deformity   ASSESSMENT AND PLAN: .   1.  Essential hypertension 2.  Mixed hyperlipidemia 3.  Morbid obesity 4.  Hypothyroidism    {Are you ordering a CV Procedure (e.g. stress test, cath, DCCV, TEE, etc)?   Press F2        :[id number]}  Dispo: ***  Signed, Dana Dory, PA-C

## 2022-11-06 ENCOUNTER — Encounter: Payer: Self-pay | Admitting: Physician Assistant

## 2022-11-06 ENCOUNTER — Ambulatory Visit: Payer: BC Managed Care – PPO | Attending: Internal Medicine | Admitting: Physician Assistant

## 2022-11-06 VITALS — BP 116/82 | HR 75 | Ht 65.0 in | Wt 256.0 lb

## 2022-11-06 DIAGNOSIS — E039 Hypothyroidism, unspecified: Secondary | ICD-10-CM

## 2022-11-06 DIAGNOSIS — I10 Essential (primary) hypertension: Secondary | ICD-10-CM | POA: Diagnosis not present

## 2022-11-06 DIAGNOSIS — E785 Hyperlipidemia, unspecified: Secondary | ICD-10-CM | POA: Diagnosis not present

## 2022-11-06 NOTE — Patient Instructions (Signed)
Medication Instructions:   Your physician recommends that you continue on your current medications as directed. Please refer to the Current Medication list given to you today.   *If you need a refill on your cardiac medications before your next appointment, please call your pharmacy*   Lab Work: MAKE SURE  YOU GET LIVER AND CHOLESTEROL  WITH PRIMARY PROVIDER    If you have labs (blood work) drawn today and your tests are completely normal, you will receive your results only by: MyChart Message (if you have MyChart) OR A paper copy in the mail If you have any lab test that is abnormal or we need to change your treatment, we will call you to review the results.   Testing/Procedures:NONE ORDERED  TODAY      Follow-Up: At [facility], you and your health needs are our priority.  As part of our continuing mission to provide you with exceptional heart care, we have created designated Provider Care Teams.  These Care Teams include your primary Cardiologist (physician) and Advanced Practice Providers (APPs -  Physician Assistants and Nurse Practitioners) who all work together to provide you with the care you need, when you need it.  We recommend signing up for the patient portal called "MyChart".  Sign up information is provided on this After Visit Summary.  MyChart is used to connect with patients for Virtual Visits (Telemedicine).  Patients are able to view lab/test results, encounter notes, upcoming appointments, etc.  Non-urgent messages can be sent to your provider as well.   To learn more about what you can do with MyChart, go to [email or web address].    Your next appointment:    1 year(s)  Provider:    Christell Constant, MD     Other Instructions

## 2022-11-08 ENCOUNTER — Encounter (INDEPENDENT_AMBULATORY_CARE_PROVIDER_SITE_OTHER): Payer: Self-pay | Admitting: Family Medicine

## 2022-11-08 ENCOUNTER — Ambulatory Visit (INDEPENDENT_AMBULATORY_CARE_PROVIDER_SITE_OTHER): Payer: BC Managed Care – PPO | Admitting: Family Medicine

## 2022-11-08 VITALS — BP 126/86 | HR 63 | Temp 97.9°F | Ht 65.0 in | Wt 253.0 lb

## 2022-11-08 DIAGNOSIS — G4733 Obstructive sleep apnea (adult) (pediatric): Secondary | ICD-10-CM | POA: Diagnosis not present

## 2022-11-08 DIAGNOSIS — M1711 Unilateral primary osteoarthritis, right knee: Secondary | ICD-10-CM

## 2022-11-08 DIAGNOSIS — R7303 Prediabetes: Secondary | ICD-10-CM | POA: Diagnosis not present

## 2022-11-08 DIAGNOSIS — E038 Other specified hypothyroidism: Secondary | ICD-10-CM

## 2022-11-08 DIAGNOSIS — E559 Vitamin D deficiency, unspecified: Secondary | ICD-10-CM

## 2022-11-08 DIAGNOSIS — Z6841 Body Mass Index (BMI) 40.0 and over, adult: Secondary | ICD-10-CM

## 2022-11-08 NOTE — Assessment & Plan Note (Signed)
R knee DJD continues to hinder walking time She is doing water exercise for 60 min 3 x a week She has gained muscle mass on bioimpedence and would like to postpone R TKR as long as possible R knee pain has slightly improved with water exercise  Continue to work on weight reduction and continue water exercise 3 x a week

## 2022-11-08 NOTE — Assessment & Plan Note (Signed)
She uses CPAP nightly but averages 5-6 hrs of sleep at night Reports chronic insomnia with difficulty falling asleep at night Using brown noise C/o high levels of anxiety  Aim for 8 hrs of sleep nightly with use of CPAP to aid in weight management Consult PCP re: treating chronic insomnia, consider counseling for underlying anxiety

## 2022-11-08 NOTE — Progress Notes (Signed)
Office: [phone]  /  Fax: [phone]  WEIGHT SUMMARY AND BIOMETRICS  Starting Date: 09/12/21  Starting Weight: 260lb   Weight Lost Since Last Visit: 0lb   Vitals Temp: 97.9 F (36.6 C) BP: 126/86 Pulse Rate: 63 SpO2: 98 %   Body Composition  Body Fat %: 48.6 % Fat Mass (lbs): 123 lbs Muscle Mass (lbs): 123.6 lbs Total Body Water (lbs): 88.8 lbs Visceral Fat Rating : 15     HPI  Chief Complaint: OBESITY  Dana Liu is here to discuss her progress with her obesity treatment plan. She is on the keeping a food journal and adhering to recommended goals of 1600 calories and 120 protein and states she is following her eating plan approximately 75 % of the time. She states she is water walking 30-45 minutes 3 times per week.   Interval History:  Since last office visit she is up 2 lb since her last visit 7 week ago Her muscle mass is up 5.4 lb and her body fat is down 3.8 lb since her last visit. This gives her a net weight loss of 7 lb in one year This is a 2.6% TBW loss without use of AOMs She is logging her caloric intake moist days of the week.   She has been using a kickboard, water weights, and treading water and can go for an hour 3 x a week and riding a recumbent bike on occasion She is holding off on R knee replacment.  She has been happy with L knee replacement She denies excessive appetite. Denies snacking from boredom or stress Craves chocolate but keeps candy out of her house  Pharmacotherapy: none  PHYSICAL EXAM:  Blood pressure 126/86, pulse 63, temperature 97.9 F (36.6 C), height 5\' 5"  (1.651 m), weight 253 lb (114.8 kg), last menstrual period 11/28/2020, SpO2 98 %. Body mass index is 42.1 kg/m.  General: She is overweight, cooperative, alert, well developed, and in no acute distress. PSYCH: Has normal mood, affect and thought process.   Lungs: Normal breathing effort, no conversational dyspnea.   ASSESSMENT AND PLAN  TREATMENT PLAN FOR  OBESITY:  Recommended Dietary Goals  Dana Liu is currently in the action stage of change. As such, her goal is to continue weight management plan. She has agreed to keeping a food journal and adhering to recommended goals of 1600 calories and 100 g of protein.  Behavioral Intervention  We discussed the following Behavioral Modification Strategies today: increasing lean protein intake, decreasing simple carbohydrates , increasing vegetables, increasing lower glycemic fruits, increasing fiber rich foods, avoiding skipping meals, increasing water intake, work on meal planning and preparation, work on tracking and journaling calories using tracking application, continue to practice mindfulness when eating, and planning for success.  Additional resources provided today: NA  Recommended Physical Activity Goals  Dana Liu has been advised to work up to 150 minutes of moderate intensity aerobic activity a week and strengthening exercises 2-3 times per week for cardiovascular health, weight loss maintenance and preservation of muscle mass.   She has agreed to Increase the intensity, frequency or duration of strengthening exercises   Pharmacotherapy changes for the treatment of obesity: none  ASSOCIATED CONDITIONS ADDRESSED TODAY  Osteoarthritis of right knee, unspecified osteoarthritis type Assessment & Plan: R knee DJD continues to hinder walking time She is doing water exercise for 60 min 3 x a week She has gained muscle mass on bioimpedence and would like to postpone R TKR as long as possible R knee pain  has slightly improved with water exercise  Continue to work on weight reduction and continue water exercise 3 x a week   Morbid obesity (HCC) with starting BMI 43 09/2021 Assessment & Plan: Reviewed overall progress over the past year of medically supervised weight management, down 2.6% TBW.  She initially tried [employer] x 2 mos, lost 7 lb and then stopped after lack of coverage only to quickly  regain weight.  She no longer has AOM coverage.  She denies overeating, overnsacking or intense cravings.  With her current meds, medical problems, avoid use of stimulants.  We discussed potential use of Orlistat or metformin but she declined for now.  With her BMI >40, she qualifies for weight loss surgery if she chooses.  Recommend dietary logging on the MyFitnessPal ap Aim for 1600 cal/ day to include 100 g of protein daily    Prediabetes Assessment & Plan: Lab Results  Component Value Date   HGBA1C 5.8 (H) 08/01/2022   Working on a low sugar/ low starch diet rich in lean protein and fiber Exercise has been limited with bilat knee DJD pain, s/p L TKR Nov 2023.  She has lost < 5% TBW loss in the past year of medically supervised weight management. She is not on metformin.  Continue to work on dietary changes and regular exercise She has declined the addition of metformin for now   Vitamin D deficiency Assessment & Plan: Last vitamin D Lab Results  Component Value Date   VD25OH 35.2 11/27/2021   She is currently on OTC vitamin D 2,000 international units daily.  Her level will be checked by endo next month  She will bring in a copy of her labs next visit Continue current supplementation of vitamin D    Obstructive sleep apnea on CPAP Assessment & Plan: She uses CPAP nightly but averages 5-6 hrs of sleep at night Reports chronic insomnia with difficulty falling asleep at night Using brown noise C/o high levels of anxiety  Aim for 8 hrs of sleep nightly with use of CPAP to aid in weight management Consult PCP re: treating chronic insomnia, consider counseling for underlying anxiety     Other specified hypothyroidism Assessment & Plan: Managed by Dr Roanna Raider at [facility].  Recent labs are not available for review. Her dose of levothyroxine was recently increased post parathyroidectomy and she is scheduled to repeat labs next month.  Energy levels are fair.  F/u with Dr  Roanna Raider to recheck thyroid labs        She was informed of the importance of frequent follow up visits to maximize her success with intensive lifestyle modifications for her multiple health conditions.   ATTESTASTION STATEMENTS:  Reviewed by clinician on day of visit: allergies, medications, problem list, medical history, surgical history, family history, social history, and previous encounter notes pertinent to obesity diagnosis.   I have personally spent 30 minutes total time today in preparation, patient care, nutritional counseling and documentation for this visit, including the following: review of clinical lab tests; review of medical tests/procedures/services.      Glennis Brink, DO DABFM, DABOM [facility] [address] [place], [place] [address] [phone]

## 2022-11-08 NOTE — Assessment & Plan Note (Addendum)
Lab Results  Component Value Date   HGBA1C 5.8 (H) 08/01/2022   Working on a low sugar/ low starch diet rich in lean protein and fiber Exercise has been limited with bilat knee DJD pain, s/p L TKR Nov 2023.  She has lost < 5% TBW loss in the past year of medically supervised weight management. She is not on metformin.  Continue to work on dietary changes and regular exercise She has declined the addition of metformin for now

## 2022-11-08 NOTE — Assessment & Plan Note (Signed)
Managed by Dr Roanna Raider at [facility].  Recent labs are not available for review. Her dose of levothyroxine was recently increased post parathyroidectomy and she is scheduled to repeat labs next month.  Energy levels are fair.  F/u with Dr Roanna Raider to recheck thyroid labs

## 2022-11-08 NOTE — Assessment & Plan Note (Addendum)
Last vitamin D Lab Results  Component Value Date   VD25OH 35.2 11/27/2021   She is currently on OTC vitamin D 2,000 international units daily.  Her level will be checked by endo next month  She will bring in a copy of her labs next visit Continue current supplementation of vitamin D

## 2022-11-08 NOTE — Assessment & Plan Note (Signed)
Reviewed overall progress over the past year of medically supervised weight management, down 2.6% TBW.  She initially tried [employer] x 2 mos, lost 7 lb and then stopped after lack of coverage only to quickly regain weight.  She no longer has AOM coverage.  She denies overeating, overnsacking or intense cravings.  With her current meds, medical problems, avoid use of stimulants.  We discussed potential use of Orlistat or metformin but she declined for now.  With her BMI >40, she qualifies for weight loss surgery if she chooses.  Recommend dietary logging on the MyFitnessPal ap Aim for 1600 cal/ day to include 100 g of protein daily

## 2022-12-17 ENCOUNTER — Ambulatory Visit (INDEPENDENT_AMBULATORY_CARE_PROVIDER_SITE_OTHER): Payer: BC Managed Care – PPO | Admitting: Family Medicine

## 2022-12-24 ENCOUNTER — Encounter (INDEPENDENT_AMBULATORY_CARE_PROVIDER_SITE_OTHER): Payer: Self-pay | Admitting: Family Medicine

## 2022-12-24 ENCOUNTER — Ambulatory Visit (INDEPENDENT_AMBULATORY_CARE_PROVIDER_SITE_OTHER): Payer: BC Managed Care – PPO | Admitting: Family Medicine

## 2022-12-24 VITALS — BP 113/77 | HR 62 | Temp 98.3°F | Ht 65.0 in | Wt 257.0 lb

## 2022-12-24 DIAGNOSIS — E88819 Insulin resistance, unspecified: Secondary | ICD-10-CM

## 2022-12-24 DIAGNOSIS — M170 Bilateral primary osteoarthritis of knee: Secondary | ICD-10-CM

## 2022-12-24 DIAGNOSIS — N951 Menopausal and female climacteric states: Secondary | ICD-10-CM | POA: Diagnosis not present

## 2022-12-24 DIAGNOSIS — R7303 Prediabetes: Secondary | ICD-10-CM

## 2022-12-24 DIAGNOSIS — Z6841 Body Mass Index (BMI) 40.0 and over, adult: Secondary | ICD-10-CM

## 2022-12-24 NOTE — Assessment & Plan Note (Signed)
Left knee pain has improved status post left TKR November 2023.  She has had some aches and pains with weather changes.  Her right knee pain have hindered long walks.  She has been able to do water exercise 3 times a week.  She does plan on trying to add in some short walks throughout the week and some resistance training exercise.  She currently uses ibuprofen 200 mg 1-2 times a day as needed pain.  She does not need to take this on a daily basis.

## 2022-12-24 NOTE — Assessment & Plan Note (Signed)
Lab Results  Component Value Date   HGBA1C 5.8 (H) 08/01/2022  [place] has she is not to work on her prediabetes without use of medication.  She is actively working on reducing her intake of sugar and starches.  She has been doing water exercise 3 days a week.  She would have liked to use a GLP-1 receptor agonist for both weight loss and prediabetes but her insurance does not cover this option.  We have discussed use of metformin.  She has declined.  Continue working on a low sugar/low starch diet, regular exercise and weight reduction.

## 2022-12-24 NOTE — Assessment & Plan Note (Signed)
Fasting insulin was 21.9 on last check.  She has lost a total of 3 pounds in the past 1+ years of medically supervised weight management.  She has not had insurance coverage for use of a GLP-1 receptor agonist.  She has been fearful of use of other oral medications.  She has been working on reducing her intake of sugar and starchy foods, increasing levels of physical activity and has been fearful of bariatric surgery.  We have discussed increasing exercise frequency to help with insulin sensitivity, reducing intake of starches further, tracking daily caloric intake including macro breakdown and potentially adding metformin.

## 2022-12-24 NOTE — Assessment & Plan Note (Signed)
Hot flashes and night sweats continue to be bothersome.  This is keeping her up at night.  She is currently not on any hormone replacement therapy.  I did recommend talking to her OB/GYN or PCP about further treatment options.  Continue active plan for weight reduction.

## 2022-12-24 NOTE — Progress Notes (Signed)
Office: [phone]  /  Fax: [phone]  WEIGHT SUMMARY AND BIOMETRICS  Starting Date: 09/12/21  Starting Weight: 260lb   Weight Lost Since Last Visit: 0lb   Vitals Temp: 98.3 F (36.8 C) BP: 113/77 Pulse Rate: 62 SpO2: 95 %   Body Composition  Body Fat %: 50.3 % Fat Mass (lbs): 129.6 lbs Muscle Mass (lbs): 121.6 lbs Total Body Water (lbs): 91 lbs Visceral Fat Rating : 16    HPI  Chief Complaint: OBESITY  Dana Liu is here to discuss her progress with her obesity treatment plan. She is on the keeping a food journal and adhering to recommended goals of 1600 calories and 100 protein and states she is following her eating plan approximately 50 % of the time. She states she is exercising 30-45 minutes 3 times per week.  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 Body mass index is 42.77 kg/m.  General: She is overweight, cooperative, alert, well developed, and in no acute distress. PSYCH: Has normal mood, affect and thought process.   Lungs: Normal breathing effort, no conversational dyspnea.   ASSESSMENT AND PLAN  TREATMENT PLAN FOR OBESITY:  Recommended Dietary Goals  Dana Liu is currently in the action stage of change. As such, her goal is to continue weight management plan. She has agreed to keeping a food journal  and adhering to recommended goals of 1600 calories and 100 g of protein. -Reviewed high protein grab and go snack choices  Behavioral Intervention  We discussed the following Behavioral Modification Strategies today: increasing lean protein intake, decreasing simple carbohydrates , increasing vegetables, increasing lower glycemic fruits, increasing water intake, work on meal planning and preparation, keeping healthy foods at home, avoiding temptations and identifying enticing environmental cues, continue to practice mindfulness when eating, and planning for success.  Additional resources provided today: NA  Recommended Physical Activity Goals  Dana Liu has been advised to work up to 150 minutes of moderate intensity aerobic activity a week and strengthening exercises 2-3 times per week for cardiovascular health, weight loss maintenance and preservation of muscle mass.   She has agreed to [employer] strengthening exercises with a goal of 2-3 sessions a week  and Increase the intensity, frequency or duration of aerobic exercises   -Continue water exercise at [facility] 3 times a week.  Plan to add in short walks throughout the week and weight training from home 1-2 times a week  Pharmacotherapy changes for the treatment of obesity: None  ASSOCIATED CONDITIONS ADDRESSED TODAY  Prediabetes Assessment & Plan: Lab Results  Component Value Date   HGBA1C 5.8 (H) 08/01/2022  Dana Liu has she is not to work on her prediabetes without use of medication.  She is actively working on reducing her intake of sugar and starches.  She has been doing water  exercise 3 days a week.  She would have liked to use a GLP-1 receptor agonist for both weight loss and prediabetes but her insurance does not cover this option.  We have discussed use of metformin.  She has declined.  Continue working on a low sugar/low starch diet, regular exercise and weight reduction.   Morbid obesity (HCC) with starting BMI 43 09/2021  BMI  40.0-44.9, adult (HCC)  Primary osteoarthritis of both knees Assessment & Plan: Left knee pain has improved status post left TKR November 2023.  She has had some aches and pains with weather changes.  Her right knee pain have hindered long walks.  She has been able to do water exercise 3 times a week.  She does plan on trying to add in some short walks throughout the week and some resistance training exercise.  She currently uses ibuprofen 200 mg 1-2 times a day as needed pain.  She does not need to take this on a daily basis.   Menopausal symptoms Assessment & Plan: Hot flashes and night sweats continue to be bothersome.  This is keeping her up at night.  She is currently not on any hormone replacement therapy.  I did recommend talking to her OB/GYN or PCP about further treatment options.  Continue active plan for weight reduction.   Insulin resistance Assessment & Plan: Fasting insulin was 21.9 on last check.  She has lost a total of 3 pounds in the past 1+ years of medically supervised weight management.  She has not had insurance coverage for use of a GLP-1 receptor agonist.  She has been fearful of use of other oral medications.  She has been working on reducing her intake of sugar and starchy foods, increasing levels of physical activity and has been fearful of bariatric surgery.  We have discussed increasing exercise frequency to help with insulin sensitivity, reducing intake of starches further, tracking daily caloric intake including macro breakdown and potentially adding metformin.       She was informed of the importance of frequent follow up visits to maximize her success with intensive lifestyle modifications for her multiple health conditions.   ATTESTASTION STATEMENTS:  Reviewed by clinician on day of visit: allergies, medications, problem list, medical history, surgical history, family history, social history, and previous encounter notes pertinent to obesity diagnosis.    I have personally spent 30 minutes total time today in preparation, patient care, nutritional counseling and documentation for this visit, including the following: review of clinical lab tests; review of medical tests/procedures/services.      Glennis Brink, DO DABFM, DABOM [facility] [address] [place], [place] [address] [phone]
